# Patient Record
Sex: Female | Born: 1995 | Race: Black or African American | Hispanic: No | Marital: Single | Smoking: Former smoker
Health system: Southern US, Community
[De-identification: ages and names within clinical notes are randomized; demographics above are authoritative.]

## PROBLEM LIST (undated history)

## (undated) ENCOUNTER — Inpatient Hospital Stay (HOSPITAL_COMMUNITY): Payer: Self-pay

## (undated) DIAGNOSIS — B999 Unspecified infectious disease: Secondary | ICD-10-CM

## (undated) DIAGNOSIS — J45909 Unspecified asthma, uncomplicated: Secondary | ICD-10-CM

## (undated) HISTORY — PX: WISDOM TOOTH EXTRACTION: SHX21

---

## 2014-10-14 ENCOUNTER — Encounter (HOSPITAL_COMMUNITY): Payer: Self-pay | Admitting: *Deleted

## 2014-10-14 ENCOUNTER — Emergency Department (HOSPITAL_COMMUNITY)
Admission: EM | Admit: 2014-10-14 | Discharge: 2014-10-14 | Disposition: A | Discharge status: Home / Self Care | Payer: Medicaid Other | Attending: Emergency Medicine | Admitting: Emergency Medicine

## 2014-10-14 DIAGNOSIS — J45909 Unspecified asthma, uncomplicated: Secondary | ICD-10-CM | POA: Diagnosis present

## 2014-10-14 DIAGNOSIS — R062 Wheezing: Secondary | ICD-10-CM

## 2014-10-14 DIAGNOSIS — J4521 Mild intermittent asthma with (acute) exacerbation: Secondary | ICD-10-CM | POA: Insufficient documentation

## 2014-10-14 DIAGNOSIS — Z87891 Personal history of nicotine dependence: Secondary | ICD-10-CM | POA: Insufficient documentation

## 2014-10-14 DIAGNOSIS — J4520 Mild intermittent asthma, uncomplicated: Secondary | ICD-10-CM

## 2014-10-14 HISTORY — DX: Unspecified asthma, uncomplicated: J45.909

## 2014-10-14 MED ORDER — ALBUTEROL SULFATE (2.5 MG/3ML) 0.083% IN NEBU
5.0000 mg | INHALATION_SOLUTION | Freq: Once | RESPIRATORY_TRACT | Status: AC
  Administered 2014-10-14: 5 mg via RESPIRATORY_TRACT
  Filled 2014-10-14: qty 6

## 2014-10-14 MED ORDER — ALBUTEROL SULFATE HFA 108 (90 BASE) MCG/ACT IN AERS
1.0000 | INHALATION_SPRAY | RESPIRATORY_TRACT | Status: DC | PRN
  Administered 2014-10-14: 2 via RESPIRATORY_TRACT
  Filled 2014-10-14: qty 6.7

## 2014-10-14 MED ORDER — PREDNISONE 20 MG PO TABS
60.0000 mg | ORAL_TABLET | Freq: Once | ORAL | Status: AC
  Administered 2014-10-14: 60 mg via ORAL
  Filled 2014-10-14: qty 3

## 2014-10-14 MED ORDER — IPRATROPIUM BROMIDE 0.02 % IN SOLN
0.5000 mg | Freq: Once | RESPIRATORY_TRACT | Status: AC
  Administered 2014-10-14: 0.5 mg via RESPIRATORY_TRACT
  Filled 2014-10-14: qty 2.5

## 2014-10-14 NOTE — ED Provider Notes (Signed)
CSN: [id number]     Arrival date & time 10/14/14  1478 History   First MD Initiated Contact with Patient 10/14/14 [phone]     Chief Complaint  Patient presents with  . Asthma     (Consider location/radiation/quality/duration/timing/severity/associated sxs/prior Treatment) The history is provided by the patient and medical records.   This is a 19 year old female with past medical history significant for asthma, presenting to the ED for wheezing. Patient states her asthma has been acting up for the past 2 weeks. She states this is typical with change of season, especially spring due to all the pollen. She is not currently taking any over-the-counter allergy medications. She denies any cough, fever, sweats, or chills. Patient recently relocated to [facility] does not have a PCP or any asthma medications currently.  VSS.  No past medical history on file. No past surgical history on file. No family history on file. History  Substance Use Topics  . Smoking status: Not on file  . Smokeless tobacco: Not on file  . Alcohol Use: Not on file   OB History    No data available     Review of Systems  Respiratory: Positive for wheezing.   All other systems reviewed and are negative.     Allergies  Review of patient's allergies indicates not on file.  Home Medications   Prior to Admission medications   Not on File   BP 142/90 mmHg  Pulse 108  Temp(Src) 99.1 F (37.3 C) (Oral)  Resp 14  SpO2 100%   Physical Exam  Constitutional: She is oriented to person, place, and time. She appears well-developed and well-nourished. No distress.  HENT:  Head: Normocephalic and atraumatic.  Right Ear: Tympanic membrane and ear canal normal.  Left Ear: Tympanic membrane and ear canal normal.  Nose: Nose normal.  Mouth/Throat: Uvula is midline, oropharynx is clear and moist and mucous membranes are normal.  Eyes: Conjunctivae and EOM are normal. Pupils are equal, round, and reactive to light.   Neck: Normal range of motion. Neck supple.  Cardiovascular: Normal rate, regular rhythm and normal heart sounds.   Pulmonary/Chest: Effort normal. No respiratory distress. She has wheezes.  Increased work of breathing, audible wheezing noted from bedside; diffuse inspiratory and expiratory wheezes throughout, speaking in full sentences  Abdominal: Soft. Bowel sounds are normal.  Musculoskeletal: Normal range of motion.  Neurological: She is alert and oriented to person, place, and time.  Skin: Skin is warm and dry. She is not diaphoretic.  Psychiatric: She has a normal mood and affect.  Nursing note and vitals reviewed.   ED Course  Procedures (including critical care time) Labs Review Labs Reviewed - No data to display  Imaging Review No results found.   EKG Interpretation None      MDM   Final diagnoses:  Asthma, mild intermittent, uncomplicated  Wheezing   19 year old female with asthma exacerbation due to change in season. This is typical for her. She is new to [facility] and does not have a primary care physician or any of her usual asthma medications. On exam, she has slightly increased work of breathing and audible wheezing but continues speaking in full sentences and is in no acute distress. She was given nebulizer treatments and dose of prednisone with significant improvement of her symptoms. She states she is feeling better. Her vital signs remained stable on room air. Patient given rescue inhaler here in the ED and discharged home. She was encouraged to establish care with new  PCP, resource guide given for reference.  Discussed plan with patient, he/she acknowledged understanding and agreed with plan of care.  Return precautions given for new or worsening symptoms.  Garlon HatchetLisa M Cynthie Garmon, PA-C 10/14/14 1102  Eber HongBrian Miller, MD 10/14/14 [phone]

## 2014-10-14 NOTE — Discharge Instructions (Signed)
Use albuterol inhaler as needed. Establish care with new primary care physician in the area, resource guide attached to help with this. Return to the ED for any new or worsening symptoms.   Emergency Department Resource Guide 1) Find a Doctor and Pay Out of Pocket Although you won't have to find out who is covered by your insurance plan, it is a good idea to ask around and get recommendations. You will then need to call the office and see if the doctor you have chosen will accept you as a new patient and what types of options they offer for patients who are self-pay. Some doctors offer discounts or will set up payment plans for their patients who do not have insurance, but you will need to ask so you aren't surprised when you get to your appointment.  2) Contact Your Local Health Department Not all health departments have doctors that can see patients for sick visits, but many do, so it is worth a call to see if yours does. If you don't know where your local health department is, you can check in your phone book. The CDC also has a tool to help you locate your state's health department, and many state websites also have listings of all of their local health departments.  3) Find a Walk-in Clinic If your illness is not likely to be very severe or complicated, you may want to try a walk in clinic. These are popping up all over the country in pharmacies, drugstores, and shopping centers. They're usually staffed by nurse practitioners or physician assistants that have been trained to treat common illnesses and complaints. They're usually fairly quick and inexpensive. However, if you have serious medical issues or chronic medical problems, these are probably not your best option.  No Primary Care Doctor: - Call Health Connect at  [phone] - they can help you locate a primary care doctor that  accepts your insurance, provides certain services, etc. - Physician Referral Service- [phone]  Chronic  Pain Problems: Organization         Address  Phone   Notes  [facility]  [phone] Patients need to be referred by their primary care doctor.   Medication Assistance: Organization         Address  Phone   Notes  [facility] [address] [place] [address] [place], [place] [id number] [phone] --Must be a resident of [place] [facility] -- Must have NO insurance coverage whatsoever (no Medicaid/ Medicare, etc.) -- The pt. MUST have a primary care doctor that directs their care regularly and follows them in the community   MedAssist  [phone]   [employer]  [phone]    Agencies that provide inexpensive medical care: Organization         Address  Phone   Notes  [facility]  [phone]   [facility]    [phone]   [facility] [address] [place], [place] [address] [phone]   [facility] [address], [place] [phone]   [facility]    [phone]   [facility]    [phone]   [facility]  [address], [place] Phone:  [phone], Fax:  [phone] Hours of Operation:  9 am - 6 pm, M-F.  Also accepts Medicaid/Medicare and self-pay.  [facility]  [address], [place] Phone: [phone], Fax: [phone]. Hours of Operation:  8:30 am - 5:30 pm, M-F.  Also accepts Medicaid and self-pay.  °[facility] [place] [address], [place] Phone: [phone]   °[facility] [address], [place], [place] [phone], Ext. [phone] Mondays & Thursdays: 7-9 AM.  First 15 patients are seen on a first come, first serve basis. °  ° °Medicaid-accepting [place] Providers: ° °Organization         Address  Phone   Notes  °[facility] [address], [place] [phone] Also  accepts self-pay patients.  °[facility] [address], [place] ° [phone]   °[facility] [address], [place] [phone]   °[facility] [address] [place] [address], [place] [phone]   °Veita Bland [address], [place]  ° [phone] Only accepts [place] Access Medicaid patients after they have their name applied to their card.  ° °Self-Pay (no insurance) in [place]: ° °Organization         Address  Phone   Notes  °Sickle Cell Patients, [facility] [address], [place] [phone]   °[facility] [address], [place] [phone]   °[facility] [place] ° [address], [place] [phone]   °[facility]/Dr. Osei-Bonsu ° [address] [place] [address], [place] or [address], [place] [phone] Phone number for both [place] and [place] locations is the same.  °[facility] [address], [place] [phone]   °[facility] [address] [place] [address], [place] or [address] [phone] °([phone]   °[facility] [address], [place] [phone], phone; [phone], fax Sees patients 1st and 3rd Saturday of every month.  Must not qualify for public or private insurance (i.e. Medicaid, Medicare, [place] Health Choice, Veterans' Benefits) • Household income should be no more than 200% of the poverty level •The clinic cannot treat you if you are pregnant or think you are pregnant • Sexually transmitted diseases are not treated at the clinic.  ° ° °Dental Care: °Organization         Address  Phone  Notes  °[place] [facility] [address], [place] [phone] Accepts children up to age 21 who are enrolled in Medicaid or [place] Health Choice; pregnant  women with a Medicaid card; and children who have applied for Medicaid or [place] Health Choice, but were declined, whose parents can pay a reduced fee at time of service.  °[place] [facility] [place]  [address], [place] [phone] Accepts children up to age 21 who are enrolled in Medicaid or [place] Health Choice; pregnant women with a Medicaid card; and children who have applied for Medicaid or  Health Choice, but were declined, whose parents can pay a reduced fee at time of service.  °[facility] ° [address],  [phone] Patients are seen by appointment only. Walk-ins are not accepted. [facility] will see patients 18 years of age and older. °Monday - Tuesday (8am-5pm) °Most Wednesdays (8:30-5pm) °$30 per visit, cash only  °[facility]  [address], [facility] [phone] Patients are seen by appointment only. Walk-ins are not accepted. [place] will see patients 60 years of age and older. One Wednesday Evening (Monthly: Volunteer Based).  $30 per visit, cash only  [place]  [phone] for adults; Children under age 4, call Graduate Pediatric Dentistry at [phone]. Children aged 49-14, please call [phone] to request a pediatric application.  Dental services are provided in all areas of dental care including fillings, crowns and bridges, complete and partial dentures, implants, gum treatment, root canals, and extractions. Preventive care is also provided. Treatment is provided to both adults and children. Patients are selected via a lottery and there is often a waiting list.   [facility] [address] [place]  [phone] [email or web address]   [facility] [address] [place], [place] [phone], Ext. [phone] Second and Fourth Thursday of each month, opens at 6:30 AM; Clinic ends at 9 AM.  Patients are  seen on a first-come first-served basis, and a limited number are seen during each clinic.   [facility]  [address] Hillard Danker [place], [place] [phone]   Eligibility Requirements You must have lived in [place], [place], or [place] counties for at least the last three months.   You cannot be eligible for state or federal sponsored [employer], including [employer], [place], or [employer].   You generally cannot be eligible for healthcare insurance through your employer.    How to apply: Eligibility screenings are held every Tuesday and Wednesday afternoon from 1:00 pm until 4:00 pm. You do not need an appointment for the interview!  [facility] [address] [place], [place]   [place] Department  [place]  [phone]    Behavioral Health Resources in the Community: Intensive Outpatient Programs Organization         Address  Phone  Notes  [facility] [place]. [address], [place], [place] [phone]   [facility] [address], [place], [place]   ADS: [facility] [address] [address], [place], [place]   [place] [address],  [place], [place] or [phone]   Substance Abuse Resources Organization         Address  Phone  Notes  [facility] Drug Services  [phone]   [place]  [phone]   [facility] [place]   Chinita Pester  [phone]   Residential & Outpatient Substance Abuse Program  [phone]   Psychological Services Organization         Address  Phone  Notes  [facility] [place]  [place]  [phone]   [place] [address] [place] [phone] or [phone]    Mobile Crisis  Teams Organization         Address  Phone  Notes  [facility], Mobile Crisis Care Unit  [phone]   [facility]  [address] [place], [place]   [facility] [address] [place] [phone]    Self-Help/Support Groups Organization         Address  Phone             Notes  [facility]  [facility] [place] - variety of support groups  [place] Call for more information  Narcotics Anonymous (NA), [facility] [address], [employer] [place]  2 meetings at this location   Special educational needs teacher         Address  Phone  Notes  [facility] [place],    [place]  [phone]   [facility]  [address], [place] [address], [place], [place]   [place], [place] [phone] Admissions: 8am-3pm M-F  Incentives Substance [place] [address],    [place], [place] [phone]   [facility] [address] [place], [place], [place]   [facility] [address]  [place], [place]   [facility] - Intensive Outpatient [address], [place], [place]   [facility] [place]  [place], [place] [phone] or [phone]   [facility] ([facility]) [address] [place], [place] Accepts Medicaid  [facility] [place] [address],  [place] [phone] Substance Abuse/Addiction Treatment   [facility] Organization         Address  Phone  Notes  [facility]  [phone]   Domenic Schwab, PhD [address] Arlis Port[place], [place]   [phone] or [phone]   [place], [place] [phone]   [facility] [address], [place], [place] [phone]  Insurance/Medicaid/sponsorship through [facility] [address] [place] [address]                                    [place], [place] [phone] [place] [address], [place] [phone]    Dr. Adele Schilder  [phone]   [facility] Dept. 1) [address] [place] 2) [place] 3)  [address], [place] [phone]  [phone]   [place] [phone] or [phone] (After Hours)

## 2014-10-14 NOTE — ED Notes (Signed)
Pt states she has wheezing x 2 weeks.  States she just moved to this area and has no MD or asthma medication.

## 2014-10-14 NOTE — ED Provider Notes (Signed)
tp has hx of asthma - out of meds - recently moved here, increase wheeze and mild cough over last couple of days, on exam has mild expiratory wheezign, speaks in full sentences, no edeam, no distress,  Mdi, steroids, home.  Medical screening examination/treatment/procedure(s) were conducted as a shared visit with non-physician practitioner(s) and myself.  I personally evaluated the patient during the encounter.  Clinical Impression:   Final diagnoses:  Asthma, mild intermittent, uncomplicated  Wheezing         Eber HongBrian Tashiba Timoney, MD 10/14/14 1550

## 2014-10-25 ENCOUNTER — Encounter (HOSPITAL_COMMUNITY): Payer: Self-pay | Admitting: *Deleted

## 2014-10-25 ENCOUNTER — Emergency Department (HOSPITAL_COMMUNITY)
Admission: EM | Admit: 2014-10-25 | Discharge: 2014-10-25 | Disposition: A | Discharge status: Home / Self Care | Payer: Medicaid Other | Attending: Emergency Medicine | Admitting: Emergency Medicine

## 2014-10-25 DIAGNOSIS — J302 Other seasonal allergic rhinitis: Secondary | ICD-10-CM | POA: Diagnosis not present

## 2014-10-25 DIAGNOSIS — Z87891 Personal history of nicotine dependence: Secondary | ICD-10-CM | POA: Diagnosis not present

## 2014-10-25 DIAGNOSIS — R000 Tachycardia, unspecified: Secondary | ICD-10-CM | POA: Insufficient documentation

## 2014-10-25 DIAGNOSIS — J45909 Unspecified asthma, uncomplicated: Secondary | ICD-10-CM | POA: Diagnosis present

## 2014-10-25 DIAGNOSIS — R062 Wheezing: Secondary | ICD-10-CM

## 2014-10-25 DIAGNOSIS — J45901 Unspecified asthma with (acute) exacerbation: Secondary | ICD-10-CM | POA: Diagnosis not present

## 2014-10-25 MED ORDER — IPRATROPIUM-ALBUTEROL 0.5-2.5 (3) MG/3ML IN SOLN
3.0000 mL | RESPIRATORY_TRACT | Status: DC
  Administered 2014-10-25: 3 mL via RESPIRATORY_TRACT
  Filled 2014-10-25: qty 3

## 2014-10-25 MED ORDER — METHYLPREDNISOLONE SODIUM SUCC 125 MG IJ SOLR
125.0000 mg | Freq: Once | INTRAMUSCULAR | Status: AC
  Administered 2014-10-25: 125 mg via INTRAVENOUS
  Filled 2014-10-25: qty 2

## 2014-10-25 MED ORDER — ALBUTEROL SULFATE HFA 108 (90 BASE) MCG/ACT IN AERS
1.0000 | INHALATION_SPRAY | RESPIRATORY_TRACT | Status: DC
  Administered 2014-10-25: 2 via RESPIRATORY_TRACT
  Filled 2014-10-25: qty 6.7

## 2014-10-25 MED ORDER — PREDNISONE 20 MG PO TABS: ORAL_TABLET | ORAL | Status: DC

## 2014-10-25 NOTE — ED Notes (Signed)
Pt reports asthma has not improved since last ED visit on 10-14-14. Pt has been using rescue inhaler with any relief of asthma Sx's.

## 2014-10-25 NOTE — Discharge Instructions (Signed)
Metered Dose Inhaler (No Spacer Used) Inhaled medicines are the basis of treatment for asthma and other breathing problems. Inhaled medicine can only be effective if used properly. Good technique assures that the medicine reaches the lungs. Metered dose inhalers (MDIs) are used to deliver a variety of inhaled medicines. These include quick relief or rescue medicines (such as bronchodilators) and controller medicines (such as corticosteroids). The medicine is delivered by pushing down on a metal canister to release a set amount of spray. If you are using different kinds of inhalers, use your quick relief medicine to open the airways 10-15 minutes before using a steroid, if instructed to do so by your health care provider. If you are unsure which inhalers to use and the order of using them, ask your health care provider, nurse, or respiratory therapist. HOW TO USE THE INHALER 1. Remove the cap from the inhaler. 2. If you are using the inhaler for the first time, you will need to prime it. Shake the inhaler for 5 seconds and release four puffs into the air, away from your face. Ask your health care provider or pharmacist if you have questions about priming your inhaler. 3. Shake the inhaler for 5 seconds before each breath in (inhalation). 4. Position the inhaler so that the top of the canister faces up. 5. Put your index finger on the top of the medicine canister. Your thumb supports the bottom of the inhaler. 6. Open your mouth. 7. Either place the inhaler between your teeth and place your lips tightly around the mouthpiece, or hold the inhaler 1-2 inches away from your open mouth. If you are unsure of which technique to use, ask your health care provider. 8. Breathe out (exhale) normally and as completely as possible. 9. Press the canister down with the index finger to release the medicine. 10. At the same time as the canister is pressed, inhale deeply and slowly until your lungs are completely filled.  This should take 4-6 seconds. Keep your tongue down. 11. Hold the medicine in your lungs for 5-10 seconds (10 seconds is best). This helps the medicine get into the small airways of your lungs. 12. Breathe out slowly, through pursed lips. Whistling is an example of pursed lips. 13. Wait at least 1 minute between puffs. Continue with the above steps until you have taken the number of puffs your health care provider has ordered. Do not use the inhaler more than your health care provider directs you to. 14. Replace the cap on the inhaler. 15. Follow the directions from your health care provider or the inhaler insert for cleaning the inhaler. If you are using a steroid inhaler, after your last puff, rinse your mouth with water, gargle, and spit out the water. Do not swallow the water. AVOID:  Inhaling before or after starting the spray of medicine. It takes practice to coordinate your breathing with triggering the spray.  Inhaling through the nose (rather than the mouth) when triggering the spray. HOW TO DETERMINE IF YOUR INHALER IS FULL OR NEARLY EMPTY You cannot know when an inhaler is empty by shaking it. Some inhalers are now being made with dose counters. Ask your health care provider for a prescription that has a dose counter if you feel you need that extra help. If your inhaler does not have a counter, ask your health care provider to help you determine the date you need to refill your inhaler. Write the refill date on a calendar or your inhaler canister. Refill  your inhaler 7-10 days before it runs out. Be sure to keep an adequate supply of medicine. This includes making sure it has not expired, and making sure you have a spare inhaler. SEEK MEDICAL CARE IF:  Symptoms are only partially relieved with your inhaler.  You are having trouble using your inhaler.  You experience an increase in phlegm. SEEK IMMEDIATE MEDICAL CARE IF:  You feel little or no relief with your inhalers. You are still  wheezing and feeling shortness of breath, tightness in your chest, or both.  You have dizziness, headaches, or a fast heart rate.  You have chills, fever, or night sweats.  There is a noticeable increase in phlegm production, or there is blood in the phlegm. MAKE SURE YOU:  Understand these instructions.  Will watch your condition.  Will get help right away if you are not doing well or get worse. Document Released: 04/29/2007 Document Revised: 11/16/2013 Document Reviewed: 12/18/2012 [facility] Patient Information 2015 [employer] [place]. This information is not intended to replace advice given to you by your health care provider. Make sure you discuss any questions you have with your health care provider.  Please take your medications as prescribed. Please follow-up with your doctor or call health and wellness for further evaluation and management of your symptoms. Return to ED for new or worsening symptoms.

## 2014-10-25 NOTE — ED Provider Notes (Signed)
CSN: [id number]     Arrival date & time 10/25/14  1043 History  This chart was scribed for non-physician practitioner Joycie PeekBenjamin Rama Mcclintock, PA-C working with Raeford RazorStephen Kohut, MD by Littie Deedsichard Sun, ED Scribe. This patient was seen in room TR08C/TR08C and the patient's care was started at 12:00 PM.     Chief Complaint  Patient presents with  . Asthma   The history is provided by the patient. No language interpreter was used.   HPI Comments: Colleen Nichols is a 19 y.o. female with a hx of asthma who presents to the Emergency Department complaining of gradual onset worsening of her asthma that started over 3 weeks ago. She attributes this to seasonal allergies. Patient was seen for this in the ED on 10/14/14 and had been given an inhaler. She has had some wheezing, but she states the wheezing and her breathing have improved. States she felt warm this morning but that has since resolved.. Patient denies rhinorrhea, chest pain and leg swelling. She has recently relocated to the area and is still waiting to be established with a PCP. She tried to go to [facility] for asthma, but she was unable to be seen due to insurance reasons. No other aggravating modifying factors  Past Medical History  Diagnosis Date  . Asthma    History reviewed. No pertinent past surgical history. History reviewed. No pertinent family history. History  Substance Use Topics  . Smoking status: Former Games developermoker  . Smokeless tobacco: Not on file  . Alcohol Use: Yes   OB History    Gravida Para Term Preterm AB TAB SAB Ectopic Multiple Living   1              Review of Systems  HENT: Negative for rhinorrhea.   Respiratory: Positive for wheezing.   Cardiovascular: Negative for chest pain and leg swelling.  Allergic/Immunologic: Positive for environmental allergies.  All other systems reviewed and are negative.     Allergies  Review of patient's allergies indicates no known allergies.  Home Medications   Prior  to Admission medications   Medication Sig Start Date End Date Taking? Authorizing Provider  predniSONE (DELTASONE) 20 MG tablet 3 tabs po day one, then 2 tabs daily x 4 days 10/25/14   Joycie PeekBenjamin Jocelyn Lowery, PA-C   BP 122/76 mmHg  Pulse 102  Temp(Src) 98.4 F (36.9 C) (Oral)  Resp 18  Ht 5\' 5"  (1.651 m)  Wt 174 lb (78.926 kg)  BMI 28.96 kg/m2  SpO2 100%  Breastfeeding? No Physical Exam  Constitutional: She is oriented to person, place, and time. She appears well-developed and well-nourished. No distress.  HENT:  Head: Normocephalic and atraumatic.  Mouth/Throat: Oropharynx is clear and moist. No oropharyngeal exudate.  Eyes: Pupils are equal, round, and reactive to light.  Neck: Neck supple.  Cardiovascular: Normal heart sounds.  Tachycardia present.   Mildly tachycardic.  Pulmonary/Chest: Effort normal.  Musculoskeletal: She exhibits no edema.  Neurological: She is alert and oriented to person, place, and time. No cranial nerve deficit.  Skin: Skin is warm and dry. No rash noted.  Psychiatric: She has a normal mood and affect. Her behavior is normal.  Nursing note and vitals reviewed.   ED Course  Procedures  DIAGNOSTIC STUDIES: Oxygen Saturation is 98% on room air, normal by my interpretation.    COORDINATION OF CARE: 12:05 PM-Discussed treatment plan which includes inhaler and steroids with pt at bedside and pt agreed to plan.    Labs Review Labs  Reviewed - No data to display  Imaging Review No results found.   EKG Interpretation None     Meds given in ED:  Medications  ipratropium-albuterol (DUONEB) 0.5-2.5 (3) MG/3ML nebulizer solution 3 mL (3 mLs Nebulization Given 10/25/14 1122)  albuterol (PROVENTIL HFA;VENTOLIN HFA) 108 (90 BASE) MCG/ACT inhaler 1-2 puff (2 puffs Inhalation Given 10/25/14 1223)  methylPREDNISolone sodium succinate (SOLU-MEDROL) 125 mg/2 mL injection 125 mg (125 mg Intravenous Given 10/25/14 1223)    New Prescriptions   PREDNISONE (DELTASONE)  20 MG TABLET    3 tabs po day one, then 2 tabs daily x 4 days   Filed Vitals:   10/25/14 1101 10/25/14 1211  BP:  122/76  Pulse: 124 102  Temp: 98.5 F (36.9 C) 98.4 F (36.9 C)  TempSrc: Oral Oral  Resp: 20 18  Height:  (1.651 m)   Weight: 174 lb (78.926 kg)   SpO2: 98% 100%     MDM  Vitals stable -  -afebrile, mild tachycardia likely associated with albuterol. Pt resting comfortably in ED. DuoNeb received in ED resolved wheezing symptoms. PE--normal lung exam. Grossly benign physical exam.  DDX--patient here for wheezing associated with asthma. Has not been able to establish care with primary care provider yet. Given Solu-Medrol 125 mg in the ED, DC with albuterol inhaler and steroid taper. Instructions to follow-up with her doctor, called health and wellness in order to establish care. Low concern for PE, no evidence of other acute or emergent pathology at this time. I discussed all relevant lab findings and imaging results with pt and they verbalized understanding. Discussed f/u with PCP within 48 hrs and return precautions, pt very amenable to plan.  Final diagnoses:  Wheezing    I personally performed the services described in this documentation, which was scribed in my presence. The recorded information has been reviewed and is accurate.    Joycie Peek, PA-C 10/25/14 1256  Raeford Razor, MD 10/25/14 [phone]

## 2015-01-11 ENCOUNTER — Encounter (HOSPITAL_COMMUNITY): Payer: Self-pay | Admitting: *Deleted

## 2015-01-11 ENCOUNTER — Emergency Department (HOSPITAL_COMMUNITY)
Admission: EM | Admit: 2015-01-11 | Discharge: 2015-01-11 | Disposition: A | Discharge status: Home / Self Care | Payer: Medicaid Other | Attending: Emergency Medicine | Admitting: Emergency Medicine

## 2015-01-11 DIAGNOSIS — J45909 Unspecified asthma, uncomplicated: Secondary | ICD-10-CM | POA: Insufficient documentation

## 2015-01-11 DIAGNOSIS — Z87891 Personal history of nicotine dependence: Secondary | ICD-10-CM | POA: Insufficient documentation

## 2015-01-11 DIAGNOSIS — B9689 Other specified bacterial agents as the cause of diseases classified elsewhere: Secondary | ICD-10-CM

## 2015-01-11 DIAGNOSIS — N760 Acute vaginitis: Secondary | ICD-10-CM | POA: Insufficient documentation

## 2015-01-11 LAB — WET PREP, GENITAL
Trich, Wet Prep: NONE SEEN
Yeast Wet Prep HPF POC: NONE SEEN

## 2015-01-11 MED ORDER — ALBUTEROL SULFATE HFA 108 (90 BASE) MCG/ACT IN AERS
2.0000 | INHALATION_SPRAY | RESPIRATORY_TRACT | Status: DC
  Administered 2015-01-11: 2 via RESPIRATORY_TRACT
  Filled 2015-01-11: qty 6.7

## 2015-01-11 MED ORDER — METRONIDAZOLE 500 MG PO TABS: 500.0000 mg | ORAL_TABLET | Freq: Two times a day (BID) | ORAL | Status: DC

## 2015-01-11 NOTE — ED Notes (Addendum)
Pt complains of vaginal itchiness and an odor starting today. Pt states she believes she may have a yeast infection. Pt denies discharge

## 2015-01-11 NOTE — ED Provider Notes (Signed)
CSN: [id number]     Arrival date & time 01/11/15  1832 History   First MD Initiated Contact with Patient 01/11/15 2135     Chief Complaint  Patient presents with  . Vaginal Itching     (Consider location/radiation/quality/duration/timing/severity/associated sxs/prior Treatment) HPI Comments: H/o yeast infection and this is similar--no fever, flank or abd pain--notes clear vaginal d/c  Patient is a 19 y.o. female presenting with vaginal itching. The history is provided by the patient.  Vaginal Itching This is a new problem. The current episode started 2 days ago. The problem occurs constantly. The problem has not changed since onset.Nothing aggravates the symptoms. Nothing relieves the symptoms. She has tried nothing for the symptoms.    Past Medical History  Diagnosis Date  . Asthma    History reviewed. No pertinent past surgical history. No family history on file. History  Substance Use Topics  . Smoking status: Former Games developermoker  . Smokeless tobacco: Not on file  . Alcohol Use: Yes   OB History    Gravida Para Term Preterm AB TAB SAB Ectopic Multiple Living   1              Review of Systems  All other systems reviewed and are negative.     Allergies  Review of patient's allergies indicates no known allergies.  Home Medications   Prior to Admission medications   Medication Sig Start Date End Date Taking? Authorizing Provider  predniSONE (DELTASONE) 20 MG tablet 3 tabs po day one, then 2 tabs daily x 4 days 10/25/14   Joycie PeekBenjamin Cartner, PA-C   BP 133/85 mmHg  Pulse 91  Temp(Src) 98.8 F (37.1 C) (Oral)  Resp 18  SpO2 98% Physical Exam  Constitutional: She is oriented to person, place, and time. She appears well-developed and well-nourished.  Non-toxic appearance. No distress.  HENT:  Head: Normocephalic and atraumatic.  Eyes: Conjunctivae, EOM and lids are normal. Pupils are equal, round, and reactive to light.  Neck: Normal range of motion. Neck supple. No  tracheal deviation present. No thyroid mass present.  Cardiovascular: Normal rate, regular rhythm and normal heart sounds.  Exam reveals no gallop.   No murmur heard. Pulmonary/Chest: Effort normal and breath sounds normal. No stridor. No respiratory distress. She has no decreased breath sounds. She has no wheezes. She has no rhonchi. She has no rales.  Abdominal: Soft. Normal appearance and bowel sounds are normal. She exhibits no distension. There is no tenderness. There is no rebound and no CVA tenderness.  Musculoskeletal: Normal range of motion. She exhibits no edema or tenderness.  Neurological: She is alert and oriented to person, place, and time. She has normal strength. No cranial nerve deficit or sensory deficit. GCS eye subscore is 4. GCS verbal subscore is 5. GCS motor subscore is 6.  Skin: Skin is warm and dry. No abrasion and no rash noted.  Psychiatric: She has a normal mood and affect. Her speech is normal and behavior is normal.  Nursing note and vitals reviewed.   ED Course  Procedures (including critical care time) Labs Review Labs Reviewed  WET PREP, GENITAL  GC/CHLAMYDIA PROBE AMP ([place]) NOT AT [facility]    Imaging Review No results found.   EKG Interpretation None      MDM   Final diagnoses:  None    Patient to be treated for bacterial vaginosis    Lorre NickAnthony Jadelin Eng, MD 01/11/15 2234

## 2015-01-11 NOTE — Discharge Instructions (Signed)
Bacterial Vaginosis Bacterial vaginosis is a vaginal infection that occurs when the normal balance of bacteria in the vagina is disrupted. It results from an overgrowth of certain bacteria. This is the most common vaginal infection in women of childbearing age. Treatment is important to prevent complications, especially in pregnant women, as it can cause a premature delivery. CAUSES  Bacterial vaginosis is caused by an increase in harmful bacteria that are normally present in smaller amounts in the vagina. Several different kinds of bacteria can cause bacterial vaginosis. However, the reason that the condition develops is not fully understood. RISK FACTORS Certain activities or behaviors can put you at an increased risk of developing bacterial vaginosis, including:  Having a new sex partner or multiple sex partners.  Douching.  Using an intrauterine device (IUD) for contraception. Women do not get bacterial vaginosis from toilet seats, bedding, swimming pools, or contact with objects around them. SIGNS AND SYMPTOMS  Some women with bacterial vaginosis have no signs or symptoms. Common symptoms include:  Grey vaginal discharge.  A fishlike odor with discharge, especially after sexual intercourse.  Itching or burning of the vagina and vulva.  Burning or pain with urination. DIAGNOSIS  Your health care provider will take a medical history and examine the vagina for signs of bacterial vaginosis. A sample of vaginal fluid may be taken. Your health care provider will look at this sample under a microscope to check for bacteria and abnormal cells. A vaginal pH test may also be done.  TREATMENT  Bacterial vaginosis may be treated with antibiotic medicines. These may be given in the form of a pill or a vaginal cream. A second round of antibiotics may be prescribed if the condition comes back after treatment.  HOME CARE INSTRUCTIONS   Only take over-the-counter or prescription medicines as  directed by your health care provider.  If antibiotic medicine was prescribed, take it as directed. Make sure you finish it even if you start to feel better.  Do not have sex until treatment is completed.  Tell all sexual partners that you have a vaginal infection. They should see their health care provider and be treated if they have problems, such as a mild rash or itching.  Practice safe sex by using condoms and only having one sex partner. SEEK MEDICAL CARE IF:   Your symptoms are not improving after 3 days of treatment.  You have increased discharge or pain.  You have a fever. MAKE SURE YOU:   Understand these instructions.  Will watch your condition.  Will get help right away if you are not doing well or get worse. FOR MORE INFORMATION  Centers for Disease Control and Prevention, Division of STD Prevention: www.cdc.gov/std American Sexual Health Association (ASHA): www.ashastd.org  Document Released: 07/02/2005 Document Revised: 04/22/2013 Document Reviewed: 02/11/2013 ExitCare Patient Information 2015 [employer]. This information is not intended to replace advice given to you by your health care provider. Make sure you discuss any questions you have with your health care provider.  

## 2015-01-12 LAB — GC/CHLAMYDIA PROBE AMP (~~LOC~~) NOT AT ARMC
CHLAMYDIA, DNA PROBE: NEGATIVE
Neisseria Gonorrhea: NEGATIVE

## 2015-03-28 ENCOUNTER — Emergency Department (HOSPITAL_COMMUNITY)
Admission: EM | Admit: 2015-03-28 | Discharge: 2015-03-28 | Disposition: A | Discharge status: Home / Self Care | Payer: Medicaid Other | Attending: Emergency Medicine | Admitting: Emergency Medicine

## 2015-03-28 ENCOUNTER — Encounter (HOSPITAL_COMMUNITY): Payer: Self-pay | Admitting: Emergency Medicine

## 2015-03-28 DIAGNOSIS — Z3202 Encounter for pregnancy test, result negative: Secondary | ICD-10-CM | POA: Insufficient documentation

## 2015-03-28 DIAGNOSIS — N390 Urinary tract infection, site not specified: Secondary | ICD-10-CM | POA: Insufficient documentation

## 2015-03-28 DIAGNOSIS — Z79899 Other long term (current) drug therapy: Secondary | ICD-10-CM | POA: Insufficient documentation

## 2015-03-28 DIAGNOSIS — J45909 Unspecified asthma, uncomplicated: Secondary | ICD-10-CM | POA: Insufficient documentation

## 2015-03-28 LAB — COMPREHENSIVE METABOLIC PANEL
ALBUMIN: 5 g/dL (ref 3.5–5.0)
ALT: 24 U/L (ref 14–54)
ANION GAP: 9 (ref 5–15)
AST: 35 U/L (ref 15–41)
Alkaline Phosphatase: 73 U/L (ref 38–126)
BUN: 8 mg/dL (ref 6–20)
CHLORIDE: 104 mmol/L (ref 101–111)
CO2: 26 mmol/L (ref 22–32)
Calcium: 9.6 mg/dL (ref 8.9–10.3)
Creatinine, Ser: 0.85 mg/dL (ref 0.44–1.00)
GFR calc Af Amer: >60 mL/min (ref >60)
GFR calc non Af Amer: >60 mL/min (ref >60)
GLUCOSE: 78 mg/dL (ref 65–99)
POTASSIUM: 3.6 mmol/L (ref 3.5–5.1)
SODIUM: 139 mmol/L (ref 135–145)
Total Bilirubin: 1.7 mg/dL — ABNORMAL HIGH (ref 0.3–1.2)
Total Protein: 8.4 g/dL — ABNORMAL HIGH (ref 6.5–8.1)

## 2015-03-28 LAB — URINE MICROSCOPIC-ADD ON

## 2015-03-28 LAB — URINALYSIS, ROUTINE W REFLEX MICROSCOPIC
BILIRUBIN URINE: NEGATIVE
Glucose, UA: NEGATIVE mg/dL
Ketones, ur: 15 mg/dL — AB
Nitrite: POSITIVE — AB
PH: 5.5 (ref 5.0–8.0)
Protein, ur: 30 mg/dL — AB
SPECIFIC GRAVITY, URINE: 1.019 (ref 1.005–1.030)
Urobilinogen, UA: 1 mg/dL (ref 0.0–1.0)

## 2015-03-28 LAB — CBC
HEMATOCRIT: 40.7 % (ref 36.0–46.0)
Hemoglobin: 14.4 g/dL (ref 12.0–15.0)
MCH: 28.7 pg (ref 26.0–34.0)
MCHC: 35.4 g/dL (ref 30.0–36.0)
MCV: 81.2 fL (ref 78.0–100.0)
Platelets: 211 10*3/uL (ref 150–400)
RBC: 5.01 MIL/uL (ref 3.87–5.11)
RDW: 13.4 % (ref 11.5–15.5)
WBC: 9.9 10*3/uL (ref 4.0–10.5)

## 2015-03-28 LAB — WET PREP, GENITAL
CLUE CELLS WET PREP: NONE SEEN
Trich, Wet Prep: NONE SEEN
YEAST WET PREP: NONE SEEN

## 2015-03-28 LAB — LIPASE, BLOOD: LIPASE: 23 U/L (ref 22–51)

## 2015-03-28 LAB — HCG, QUANTITATIVE, PREGNANCY

## 2015-03-28 MED ORDER — CEPHALEXIN 500 MG PO CAPS: 500.0000 mg | ORAL_CAPSULE | Freq: Four times a day (QID) | ORAL | Status: DC

## 2015-03-28 MED ORDER — ALBUTEROL SULFATE HFA 108 (90 BASE) MCG/ACT IN AERS
2.0000 | INHALATION_SPRAY | Freq: Once | RESPIRATORY_TRACT | Status: AC
  Administered 2015-03-28: 2 via RESPIRATORY_TRACT
  Filled 2015-03-28: qty 6.7

## 2015-03-28 MED ORDER — CEPHALEXIN 250 MG PO CAPS: 250.0000 mg | ORAL_CAPSULE | Freq: Four times a day (QID) | ORAL | Status: DC

## 2015-03-28 NOTE — ED Notes (Signed)
Pt c/o mid medial abdominal pain onset today. Denies emesis or diarrhea. Pt also requests an inhaler for night-time wheezing with asthma. Denies SOB currently.

## 2015-03-28 NOTE — ED Notes (Signed)
Patient c/o lower abdominal pain x a few hours.  Pt states she had one episode of nausea today.  Pt denies vomiting and diarrhea.  Pt denies urinary symptoms and vaginal discharge.

## 2015-03-28 NOTE — ED Provider Notes (Signed)
CSN: [id number]     Arrival date & time 03/28/15  1632 History   First MD Initiated Contact with Patient 03/28/15 1924     Chief Complaint  Patient presents with  . Abdominal Pain     (Consider location/radiation/quality/duration/timing/severity/associated sxs/prior Treatment) HPI Colleen Nichols is a 19 y.o. female history of asthma, presents to emergency department complaining of abdominal pain. Patient states pain started several hours ago while she was at work. She reports pain is suprapubic, radiates to the back. Denies associated vaginal discharge or bleeding. She denies any urinary symptoms. She denies any nausea, vomiting, diarrhea. Pain is constant, feels crampy. She states she normally does not have menstrual periods because she is on Depakote. She denies being pregnant. She also requests albuterol inhaler, she states she usually wheezes at nighttime. Denies any complaints at this time. No treatment prior to coming in.    Past Medical History  Diagnosis Date  . Asthma    History reviewed. No pertinent past surgical history. History reviewed. No pertinent family history. Social History  Substance Use Topics  . Smoking status: Former Games developer  . Smokeless tobacco: None  . Alcohol Use: Yes   OB History    Gravida Para Term Preterm AB TAB SAB Ectopic Multiple Living   1              Review of Systems  Constitutional: Negative for fever and chills.  Respiratory: Negative for cough, chest tightness and shortness of breath.   Cardiovascular: Negative for chest pain, palpitations and leg swelling.  Gastrointestinal: Positive for abdominal pain. Negative for nausea, vomiting and diarrhea.  Genitourinary: Positive for pelvic pain. Negative for dysuria, urgency, hematuria, flank pain, vaginal bleeding, vaginal discharge and vaginal pain.  Musculoskeletal: Negative for myalgias, arthralgias, neck pain and neck stiffness.  Skin: Negative for rash.  Neurological: Negative for  dizziness, weakness and headaches.  All other systems reviewed and are negative.     Allergies  Review of patient's allergies indicates no known allergies.  Home Medications   Prior to Admission medications   Medication Sig Start Date End Date Taking? Authorizing Provider  PROAIR HFA 108 (90 BASE) MCG/ACT inhaler INHALE 1 TO 2 PFS PO Q 4 TO 6 H PRF COUGH OR WHEEZE 11/26/14  Yes Historical Provider, MD  metroNIDAZOLE (FLAGYL) 500 MG tablet Take 1 tablet (500 mg total) by mouth 2 (two) times daily. Patient not taking: Reported on 03/28/2015 01/11/15   Lorre Nick, MD   BP 132/82 mmHg  Pulse 92  Temp(Src) 98.3 F (36.8 C) (Oral)  Resp 18  SpO2 100% Physical Exam  Constitutional: She is oriented to person, place, and time. She appears well-developed and well-nourished. No distress.  HENT:  Head: Normocephalic.  Eyes: Conjunctivae are normal.  Neck: Neck supple.  Cardiovascular: Normal rate, regular rhythm and normal heart sounds.   Pulmonary/Chest: Effort normal and breath sounds normal. No respiratory distress. She has no wheezes. She has no rales.  Abdominal: Soft. Bowel sounds are normal. She exhibits no distension. There is tenderness. There is no rebound.  Suprapubic tenderness  Genitourinary:  Normal external genitalia. Normal vaginal canal. Small thin white discharge. Cervix is normal, closed. No CMT. No uterine or adnexal tenderness. No masses palpated.    Musculoskeletal: She exhibits no edema.  Neurological: She is alert and oriented to person, place, and time.  Skin: Skin is warm and dry.  Psychiatric: She has a normal mood and affect. Her behavior is normal.  Nursing note and vitals  reviewed.   ED Course  Procedures (including critical care time) Labs Review Labs Reviewed  COMPREHENSIVE METABOLIC PANEL - Abnormal; Notable for the following:    Total Protein 8.4 (*)    Total Bilirubin 1.7 (*)    All other components within normal limits  WET PREP, GENITAL   LIPASE, BLOOD  CBC  HCG, QUANTITATIVE, PREGNANCY  URINALYSIS, ROUTINE W REFLEX MICROSCOPIC (NOT AT [facility])  GC/CHLAMYDIA PROBE AMP ([place]) NOT AT [facility]    Imaging Review No results found. I have personally reviewed and evaluated these images and lab results as part of my medical decision-making.   EKG Interpretation None      MDM   Final diagnoses:  UTI (lower urinary tract infection)    patient suprapubic abdominal pain, no other complaints. No mcburneys point tenderness. Vital signs are normal. She is afebrile. Nontoxic appearing. Exam shows suprapubic tenderness. No adnexal tenderness. No concern for ovarian torsion or cyst.   UA showing infection. Pelvic exam unremarkable. Will treat with keflex. Pt also requesting inhaler for asthma, will provide. Follow up with pcp.   Filed Vitals:   03/28/15 1700 03/28/15 2131  BP: 132/82 122/76  Pulse: 92 111  Temp: 98.3 F (36.8 C)   TempSrc: Oral   Resp: 18 18  SpO2: 100% 100%       Jaynie Crumble, PA-C 03/29/15 0022  Lorre Nick, MD 03/29/15 2351

## 2015-03-28 NOTE — Discharge Instructions (Signed)
Take Keflex as prescribed until all gone for UTI. Drink plenty of fluids. Follow-up as needed..  Urinary Tract Infection Urinary tract infections (UTIs) can develop anywhere along your urinary tract. Your urinary tract is your body's drainage system for removing wastes and extra water. Your urinary tract includes two kidneys, two ureters, a bladder, and a urethra. Your kidneys are a pair of bean-shaped organs. Each kidney is about the size of your fist. They are located below your ribs, one on each side of your spine. CAUSES Infections are caused by microbes, which are microscopic organisms, including fungi, viruses, and bacteria. These organisms are so small that they can only be seen through a microscope. Bacteria are the microbes that most commonly cause UTIs. SYMPTOMS  Symptoms of UTIs may vary by age and gender of the patient and by the location of the infection. Symptoms in young women typically include a frequent and intense urge to urinate and a painful, burning feeling in the bladder or urethra during urination. Older women and men are more likely to be tired, shaky, and weak and have muscle aches and abdominal pain. A fever may mean the infection is in your kidneys. Other symptoms of a kidney infection include pain in your back or sides below the ribs, nausea, and vomiting. DIAGNOSIS To diagnose a UTI, your caregiver will ask you about your symptoms. Your caregiver also will ask to provide a urine sample. The urine sample will be tested for bacteria and white blood cells. White blood cells are made by your body to help fight infection. TREATMENT  Typically, UTIs can be treated with medication. Because most UTIs are caused by a bacterial infection, they usually can be treated with the use of antibiotics. The choice of antibiotic and length of treatment depend on your symptoms and the type of bacteria causing your infection. HOME CARE INSTRUCTIONS  If you were prescribed antibiotics, take them  exactly as your caregiver instructs you. Finish the medication even if you feel better after you have only taken some of the medication.  Drink enough water and fluids to keep your urine clear or pale yellow.  Avoid caffeine, tea, and carbonated beverages. They tend to irritate your bladder.  Empty your bladder often. Avoid holding urine for long periods of time.  Empty your bladder before and after sexual intercourse.  After a bowel movement, women should cleanse from front to back. Use each tissue only once. SEEK MEDICAL CARE IF:   You have back pain.  You develop a fever.  Your symptoms do not begin to resolve within 3 days. SEEK IMMEDIATE MEDICAL CARE IF:   You have severe back pain or lower abdominal pain.  You develop chills.  You have nausea or vomiting.  You have continued burning or discomfort with urination. MAKE SURE YOU:   Understand these instructions.  Will watch your condition.  Will get help right away if you are not doing well or get worse. Document Released: 04/11/2005 Document Revised: 01/01/2012 Document Reviewed: 08/10/2011 [facility] Patient Information 2015 [place], [place]. This information is not intended to replace advice given to you by your health care provider. Make sure you discuss any questions you have with your health care provider.

## 2015-03-28 NOTE — Progress Notes (Signed)
Patient listed as having Medicaid Family Planning.  EDCM spoke to patient at bedside. Patient confirms she does not have a pcp or insurance living in [place].  [facility] provided patient with contact information to [facility], informed patient of services there.  EDCM also provided patient with list of pcps who accept self pay patients, list of discount pharmacies and websites needymeds.org and GoodRX.com for medication assistance, phone number to inquire about the orange card, phone number to inquire about Mediciad, phone number to inquire about the Affordable Care Act, financial resources in the community such as local churches, salvation [employer], urban ministries, and dental assistance for uninsured patients. Patient reports her insurance is going to change through her job.  Patient thankful for resources.  No further EDCM needs at this time.

## 2015-03-29 LAB — GC/CHLAMYDIA PROBE AMP (~~LOC~~) NOT AT ARMC
Chlamydia: NEGATIVE
NEISSERIA GONORRHEA: NEGATIVE

## 2015-05-07 ENCOUNTER — Encounter (HOSPITAL_COMMUNITY): Payer: Self-pay | Admitting: Nurse Practitioner

## 2015-05-07 ENCOUNTER — Emergency Department (HOSPITAL_COMMUNITY)
Admission: EM | Admit: 2015-05-07 | Discharge: 2015-05-07 | Disposition: A | Discharge status: Home / Self Care | Payer: Medicaid Other | Attending: Emergency Medicine | Admitting: Emergency Medicine

## 2015-05-07 ENCOUNTER — Emergency Department (HOSPITAL_COMMUNITY): Payer: Medicaid Other

## 2015-05-07 DIAGNOSIS — J45909 Unspecified asthma, uncomplicated: Secondary | ICD-10-CM | POA: Diagnosis not present

## 2015-05-07 DIAGNOSIS — Z792 Long term (current) use of antibiotics: Secondary | ICD-10-CM | POA: Diagnosis not present

## 2015-05-07 DIAGNOSIS — Y9389 Activity, other specified: Secondary | ICD-10-CM | POA: Insufficient documentation

## 2015-05-07 DIAGNOSIS — S20212A Contusion of left front wall of thorax, initial encounter: Secondary | ICD-10-CM | POA: Insufficient documentation

## 2015-05-07 DIAGNOSIS — Z87891 Personal history of nicotine dependence: Secondary | ICD-10-CM | POA: Insufficient documentation

## 2015-05-07 DIAGNOSIS — S299XXA Unspecified injury of thorax, initial encounter: Secondary | ICD-10-CM | POA: Diagnosis present

## 2015-05-07 DIAGNOSIS — Y998 Other external cause status: Secondary | ICD-10-CM | POA: Diagnosis not present

## 2015-05-07 DIAGNOSIS — Y92410 Unspecified street and highway as the place of occurrence of the external cause: Secondary | ICD-10-CM | POA: Diagnosis not present

## 2015-05-07 MED ORDER — IBUPROFEN 800 MG PO TABS
800.0000 mg | ORAL_TABLET | Freq: Three times a day (TID) | ORAL | Status: DC
Start: 2015-05-07 — End: 2016-12-07

## 2015-05-07 MED ORDER — IBUPROFEN 800 MG PO TABS: 800.0000 mg | ORAL_TABLET | Freq: Three times a day (TID) | ORAL | Status: DC

## 2015-05-07 NOTE — Discharge Instructions (Signed)
Chest Contusion A chest contusion is a deep bruise on your chest area. Contusions are the result of an injury that caused bleeding under the skin. A chest contusion may involve bruising of the skin, muscles, or ribs. The contusion may turn blue, purple, or yellow. Minor injuries will give you a painless contusion, but more severe contusions may stay painful and swollen for a few weeks. CAUSES  A contusion is usually caused by a blow, trauma, or direct force to an area of the body. SYMPTOMS   Swelling and redness of the injured area.  Discoloration of the injured area.  Tenderness and soreness of the injured area.  Pain. DIAGNOSIS  The diagnosis can be made by taking a history and performing a physical exam. An X-ray, CT scan, or MRI may be needed to determine if there were any associated injuries, such as broken bones (fractures) or internal injuries. TREATMENT  Often, the best treatment for a chest contusion is resting, icing, and applying cold compresses to the injured area. Deep breathing exercises may be recommended to reduce the risk of pneumonia. Over-the-counter medicines may also be recommended for pain control. HOME CARE INSTRUCTIONS   Put ice on the injured area.  Put ice in a plastic bag.  Place a towel between your skin and the bag.  Leave the ice on for 15-20 minutes, 03-04 times a day.  Only take over-the-counter or prescription medicines as directed by your caregiver. Your caregiver may recommend avoiding anti-inflammatory medicines (aspirin, ibuprofen, and naproxen) for 48 hours because these medicines may increase bruising.  Rest the injured area.  Perform deep-breathing exercises as directed by your caregiver.  Stop smoking if you smoke.  Do not lift objects over 5 pounds (2.3 kg) for 3 days or longer if recommended by your caregiver. SEEK IMMEDIATE MEDICAL CARE IF:   You have increased bruising or swelling.  You have pain that is getting worse.  You have  difficulty breathing.  You have dizziness, weakness, or fainting.  You have blood in your urine or stool.  You cough up or vomit blood.  Your swelling or pain is not relieved with medicines. MAKE SURE YOU:   Understand these instructions.  Will watch your condition.  Will get help right away if you are not doing well or get worse.   This information is not intended to replace advice given to you by your health care provider. Make sure you discuss any questions you have with your health care provider.   Document Released: 03/27/2001 Document Revised: 03/26/2012 Document Reviewed: 12/24/2011 Elsevier Interactive Patient Education 2016 Elsevier Inc.  
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
## 2016-12-07 ENCOUNTER — Inpatient Hospital Stay (HOSPITAL_COMMUNITY)
Admission: AD | Admit: 2016-12-07 | Discharge: 2016-12-07 | Disposition: A | Discharge status: Home / Self Care | Payer: Medicaid Other | Source: Ambulatory Visit | Attending: Obstetrics & Gynecology | Admitting: Obstetrics & Gynecology

## 2016-12-07 ENCOUNTER — Inpatient Hospital Stay (HOSPITAL_BASED_OUTPATIENT_CLINIC_OR_DEPARTMENT_OTHER): Payer: Medicaid Other

## 2016-12-07 ENCOUNTER — Inpatient Hospital Stay (HOSPITAL_COMMUNITY): Payer: Medicaid Other

## 2016-12-07 ENCOUNTER — Encounter: Payer: Self-pay | Admitting: Student

## 2016-12-07 DIAGNOSIS — O99519 Diseases of the respiratory system complicating pregnancy, unspecified trimester: Secondary | ICD-10-CM

## 2016-12-07 DIAGNOSIS — J45909 Unspecified asthma, uncomplicated: Secondary | ICD-10-CM | POA: Diagnosis not present

## 2016-12-07 DIAGNOSIS — R109 Unspecified abdominal pain: Secondary | ICD-10-CM | POA: Diagnosis present

## 2016-12-07 DIAGNOSIS — O208 Other hemorrhage in early pregnancy: Secondary | ICD-10-CM | POA: Insufficient documentation

## 2016-12-07 DIAGNOSIS — O9989 Other specified diseases and conditions complicating pregnancy, childbirth and the puerperium: Secondary | ICD-10-CM | POA: Diagnosis not present

## 2016-12-07 DIAGNOSIS — Z3491 Encounter for supervision of normal pregnancy, unspecified, first trimester: Secondary | ICD-10-CM

## 2016-12-07 DIAGNOSIS — R0602 Shortness of breath: Secondary | ICD-10-CM | POA: Insufficient documentation

## 2016-12-07 DIAGNOSIS — R9431 Abnormal electrocardiogram [ECG] [EKG]: Secondary | ICD-10-CM | POA: Diagnosis not present

## 2016-12-07 DIAGNOSIS — O99512 Diseases of the respiratory system complicating pregnancy, second trimester: Secondary | ICD-10-CM | POA: Insufficient documentation

## 2016-12-07 DIAGNOSIS — R000 Tachycardia, unspecified: Secondary | ICD-10-CM | POA: Insufficient documentation

## 2016-12-07 DIAGNOSIS — O26891 Other specified pregnancy related conditions, first trimester: Secondary | ICD-10-CM | POA: Insufficient documentation

## 2016-12-07 DIAGNOSIS — O26892 Other specified pregnancy related conditions, second trimester: Secondary | ICD-10-CM | POA: Insufficient documentation

## 2016-12-07 DIAGNOSIS — R002 Palpitations: Secondary | ICD-10-CM

## 2016-12-07 DIAGNOSIS — Z3A01 Less than 8 weeks gestation of pregnancy: Secondary | ICD-10-CM | POA: Insufficient documentation

## 2016-12-07 DIAGNOSIS — O219 Vomiting of pregnancy, unspecified: Secondary | ICD-10-CM | POA: Diagnosis not present

## 2016-12-07 DIAGNOSIS — O3680X0 Pregnancy with inconclusive fetal viability, not applicable or unspecified: Secondary | ICD-10-CM

## 2016-12-07 DIAGNOSIS — Z87891 Personal history of nicotine dependence: Secondary | ICD-10-CM | POA: Diagnosis not present

## 2016-12-07 LAB — URINALYSIS, ROUTINE W REFLEX MICROSCOPIC
Bilirubin Urine: NEGATIVE
Glucose, UA: NEGATIVE mg/dL
Hgb urine dipstick: NEGATIVE
Ketones, ur: 5 mg/dL — AB
Leukocytes, UA: NEGATIVE
NITRITE: NEGATIVE
Protein, ur: NEGATIVE mg/dL
SPECIFIC GRAVITY, URINE: 1.02 (ref 1.005–1.030)
pH: 6 (ref 5.0–8.0)

## 2016-12-07 LAB — COMPREHENSIVE METABOLIC PANEL
ALK PHOS: 56 U/L (ref 38–126)
ALT: 14 U/L (ref 14–54)
ANION GAP: 6 (ref 5–15)
AST: 21 U/L (ref 15–41)
Albumin: 4.2 g/dL (ref 3.5–5.0)
BUN: 9 mg/dL (ref 6–20)
CALCIUM: 9.6 mg/dL (ref 8.9–10.3)
CO2: 25 mmol/L (ref 22–32)
Chloride: 103 mmol/L (ref 101–111)
Creatinine, Ser: 0.6 mg/dL (ref 0.44–1.00)
GFR calc non Af Amer: >60 mL/min (ref >60)
Glucose, Bld: 79 mg/dL (ref 65–99)
Potassium: 3.6 mmol/L (ref 3.5–5.1)
SODIUM: 134 mmol/L — AB (ref 135–145)
TOTAL PROTEIN: 6.9 g/dL (ref 6.5–8.1)
Total Bilirubin: 1.4 mg/dL — ABNORMAL HIGH (ref 0.3–1.2)

## 2016-12-07 LAB — ECHOCARDIOGRAM COMPLETE
HEIGHTINCHES: 65 in
Weight: 2140.8 oz

## 2016-12-07 LAB — CBC
HEMATOCRIT: 37.6 % (ref 36.0–46.0)
HEMOGLOBIN: 13.3 g/dL (ref 12.0–15.0)
MCH: 29.5 pg (ref 26.0–34.0)
MCHC: 35.4 g/dL (ref 30.0–36.0)
MCV: 83.4 fL (ref 78.0–100.0)
Platelets: 174 10*3/uL (ref 150–400)
RBC: 4.51 MIL/uL (ref 3.87–5.11)
RDW: 12.8 % (ref 11.5–15.5)
WBC: 4.6 10*3/uL (ref 4.0–10.5)

## 2016-12-07 LAB — POCT PREGNANCY, URINE: PREG TEST UR: POSITIVE — AB

## 2016-12-07 LAB — HCG, QUANTITATIVE, PREGNANCY: hCG, Beta Chain, Quant, S: 36514 m[IU]/mL — ABNORMAL HIGH (ref <5)

## 2016-12-07 LAB — WET PREP, GENITAL
SPERM: NONE SEEN
Trich, Wet Prep: NONE SEEN
Yeast Wet Prep HPF POC: NONE SEEN

## 2016-12-07 LAB — ABO/RH: ABO/RH(D): O POS

## 2016-12-07 MED ORDER — PROMETHAZINE HCL 25 MG PO TABS: 12.5000 mg | ORAL_TABLET | Freq: Four times a day (QID) | ORAL | 2 refills | Status: DC | PRN

## 2016-12-07 MED ORDER — ALBUTEROL SULFATE HFA 108 (90 BASE) MCG/ACT IN AERS: 2.0000 | INHALATION_SPRAY | Freq: Four times a day (QID) | RESPIRATORY_TRACT | 2 refills | Status: DC | PRN

## 2016-12-07 NOTE — MAU Note (Signed)
Pt states her last period was 10-30-2016.  She reports feeling short of breath for about the past 3 days. Her inhaler has been empty for several days. States her heart was racing this am. Also reports feeling very tired.  Pt denies bleeding, but reports some abdominal cramping today.

## 2016-12-07 NOTE — Progress Notes (Signed)
RN into room to discharge pt, pt not in room. Unable to find pt on unit to give discharge instructions. Pt did not receive AVS.

## 2016-12-07 NOTE — Discharge Instructions (Signed)
[facility]  ° ° °[facility]       Phone: [phone] ° °[facility] Phone: [phone] ° °[facility] [place]  Phone: [phone] ° °[facility] [place]  Phone: [phone] ° °[facility] [place]  Phone: [phone] ° °[facility]       Phone: [phone] ° °[facility]    Phone: [phone]  ° °[facility]    Phone: [phone] ° °[facility]    Phone: [phone] ° °[facility]    Phone: [phone] ° °[facility]    Phone: [phone] ° °[facility]       Phone: [phone]  ° °[facility]  Phone: [phone] ° °[facility]    Phone: [phone] ° °[facility]   Phone: [phone] ° °[facility]      Phone: [phone] ° °[facility]    Phone: [phone] ° °

## 2016-12-07 NOTE — Progress Notes (Signed)
  Echocardiogram 2D Echocardiogram has been performed.  Colleen Nichols, Colleen Nichols 12/07/2016, 4:36 PM
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
## 2016-12-08 LAB — HIV ANTIBODY (ROUTINE TESTING W REFLEX): HIV Screen 4th Generation wRfx: NONREACTIVE

## 2016-12-11 LAB — GC/CHLAMYDIA PROBE AMP (~~LOC~~) NOT AT ARMC
CHLAMYDIA, DNA PROBE: NEGATIVE
NEISSERIA GONORRHEA: NEGATIVE

## 2016-12-18 ENCOUNTER — Ambulatory Visit: Payer: Medicaid Other

## 2016-12-18 ENCOUNTER — Ambulatory Visit (HOSPITAL_COMMUNITY)
Admission: RE | Admit: 2016-12-18 | Discharge: 2016-12-18 | Disposition: A | Discharge status: Home / Self Care | Payer: Medicaid Other | Source: Ambulatory Visit | Attending: Advanced Practice Midwife | Admitting: Advanced Practice Midwife

## 2016-12-18 DIAGNOSIS — R109 Unspecified abdominal pain: Secondary | ICD-10-CM | POA: Diagnosis not present

## 2016-12-18 DIAGNOSIS — O3680X0 Pregnancy with inconclusive fetal viability, not applicable or unspecified: Secondary | ICD-10-CM | POA: Diagnosis not present

## 2016-12-18 DIAGNOSIS — Z3A01 Less than 8 weeks gestation of pregnancy: Secondary | ICD-10-CM | POA: Diagnosis not present

## 2016-12-18 DIAGNOSIS — Z712 Person consulting for explanation of examination or test findings: Secondary | ICD-10-CM

## 2016-12-18 DIAGNOSIS — O26891 Other specified pregnancy related conditions, first trimester: Secondary | ICD-10-CM | POA: Diagnosis not present

## 2016-12-18 NOTE — Progress Notes (Signed)
Patient presented to office today for a u/s results. Per U/S result patient has a viable pregnancy with expected due date of 08/13/2017. Patient was advised to start prenatal care and to continued taking prenatal vitamins.

## 2016-12-19 ENCOUNTER — Encounter: Payer: Medicaid Other | Admitting: Certified Nurse Midwife

## 2016-12-20 ENCOUNTER — Encounter (HOSPITAL_COMMUNITY): Payer: Self-pay | Admitting: *Deleted

## 2016-12-20 ENCOUNTER — Inpatient Hospital Stay (HOSPITAL_COMMUNITY)
Admission: AD | Admit: 2016-12-20 | Discharge: 2016-12-20 | Disposition: A | Discharge status: Home / Self Care | Payer: Medicaid Other | Source: Ambulatory Visit | Attending: Family Medicine | Admitting: Family Medicine

## 2016-12-20 DIAGNOSIS — O26891 Other specified pregnancy related conditions, first trimester: Secondary | ICD-10-CM | POA: Diagnosis not present

## 2016-12-20 DIAGNOSIS — O209 Hemorrhage in early pregnancy, unspecified: Secondary | ICD-10-CM | POA: Diagnosis not present

## 2016-12-20 DIAGNOSIS — O99511 Diseases of the respiratory system complicating pregnancy, first trimester: Secondary | ICD-10-CM | POA: Insufficient documentation

## 2016-12-20 DIAGNOSIS — J45909 Unspecified asthma, uncomplicated: Secondary | ICD-10-CM | POA: Insufficient documentation

## 2016-12-20 DIAGNOSIS — Z87891 Personal history of nicotine dependence: Secondary | ICD-10-CM | POA: Diagnosis not present

## 2016-12-20 DIAGNOSIS — Z79899 Other long term (current) drug therapy: Secondary | ICD-10-CM | POA: Diagnosis not present

## 2016-12-20 DIAGNOSIS — N898 Other specified noninflammatory disorders of vagina: Secondary | ICD-10-CM | POA: Diagnosis not present

## 2016-12-20 DIAGNOSIS — Z3A01 Less than 8 weeks gestation of pregnancy: Secondary | ICD-10-CM | POA: Diagnosis not present
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
## 2016-12-20 NOTE — MAU Note (Signed)
PT  SAYS SHE HAS VAG BLEEDING    STARTED ON 6-5     WHEN  SHE  WIPES - PINK .   NO PAD   ON IN TRIAGE .    FEELS  MILD  CRAMPS - 2. NO MEDS  FOR PAIN.   LAST SEX-   YESTERDAY.   HAD U/S ON 6-5  OUTPT.

## 2016-12-20 NOTE — MAU Note (Signed)
Urine sent to lab 

## 2016-12-20 NOTE — Discharge Instructions (Signed)
Vaginal Bleeding During Pregnancy, First Trimester °A small amount of bleeding (spotting) from the vagina is common in early pregnancy. Sometimes the bleeding is normal and is not a problem, and sometimes it is a sign of something serious. Be sure to tell your doctor about any bleeding from your vagina right away. °Follow these instructions at home: °· Watch your condition for any changes. °· Follow your doctor's instructions about how active you can be. °· If you are on bed rest: °? You may need to stay in bed and only get up to use the bathroom. °? You may be allowed to do some activities. °? If you need help, make plans for someone to help you. °· Write down: °? The number of pads you use each day. °? How often you change pads. °? How soaked (saturated) your pads are. °· Do not use tampons. °· Do not douche. °· Do not have sex or orgasms until your doctor says it is okay. °· If you pass any tissue from your vagina, save the tissue so you can show it to your doctor. °· Only take medicines as told by your doctor. °· Do not take aspirin because it can make you bleed. °· Keep all follow-up visits as told by your doctor. °Contact a doctor if: °· You bleed from your vagina. °· You have cramps. °· You have labor pains. °· You have a fever that does not go away after you take medicine. °Get help right away if: °· You have very bad cramps in your back or belly (abdomen). °· You pass large clots or tissue from your vagina. °· You bleed more. °· You feel light-headed or weak. °· You pass out (faint). °· You have chills. °· You are leaking fluid or have a gush of fluid from your vagina. °· You pass out while pooping (having a bowel movement). °This information is not intended to replace advice given to you by your health care provider. Make sure you discuss any questions you have with your health care provider. °Document Released: 11/16/2013 Document Revised: 12/08/2015 Document Reviewed: 03/09/2013 °Elsevier Interactive  Patient Education © 2018 [employer] ° °

## 2016-12-26 ENCOUNTER — Encounter: Payer: Medicaid Other | Admitting: Obstetrics

## 2017-01-10 ENCOUNTER — Ambulatory Visit (INDEPENDENT_AMBULATORY_CARE_PROVIDER_SITE_OTHER): Payer: Medicaid Other | Admitting: Certified Nurse Midwife

## 2017-01-10 ENCOUNTER — Encounter: Payer: Self-pay | Admitting: *Deleted

## 2017-01-10 ENCOUNTER — Encounter: Payer: Self-pay | Admitting: Certified Nurse Midwife

## 2017-01-10 ENCOUNTER — Other Ambulatory Visit (HOSPITAL_COMMUNITY)
Admission: RE | Admit: 2017-01-10 | Discharge: 2017-01-10 | Disposition: A | Discharge status: Home / Self Care | Payer: Medicaid Other | Source: Ambulatory Visit | Attending: Certified Nurse Midwife | Admitting: Certified Nurse Midwife

## 2017-01-10 VITALS — BP 124/75 | HR 98 | Wt 136.0 lb

## 2017-01-10 DIAGNOSIS — Z3481 Encounter for supervision of other normal pregnancy, first trimester: Secondary | ICD-10-CM

## 2017-01-10 DIAGNOSIS — Z3491 Encounter for supervision of normal pregnancy, unspecified, first trimester: Secondary | ICD-10-CM | POA: Diagnosis not present

## 2017-01-10 DIAGNOSIS — Z3490 Encounter for supervision of normal pregnancy, unspecified, unspecified trimester: Secondary | ICD-10-CM | POA: Diagnosis present

## 2017-01-10 MED ORDER — PRENATE PIXIE 10-0.6-0.4-200 MG PO CAPS: 1.0000 | ORAL_CAPSULE | Freq: Every day | ORAL | 12 refills | Status: DC

## 2017-01-10 NOTE — Progress Notes (Signed)
Subjective:    Colleen Nichols is being seen today for her first obstetrical visit.  This is a planned pregnancy. She is at [redacted]w[redacted]d gestation. Her obstetrical history is significant for Asthma. Relationship with FOB: spouse, living together. Patient does intend to breast feed. Pregnancy history fully reviewed.  The information documented in the HPI was reviewed and verified.  Menstrual History: OB History    Gravida Para Term Preterm AB Living   2 1 1     1    SAB TAB Ectopic Multiple Live Births           1       Patient's last menstrual period was 10/30/2016 (exact date).    Past Medical History:  Diagnosis Date  . Asthma     Past Surgical History:  Procedure Laterality Date  . WISDOM TOOTH EXTRACTION       (Not in a hospital admission) No Known Allergies  Social History  Substance Use Topics  . Smoking status: Former Games developer  . Smokeless tobacco: Never Used  . Alcohol use Yes     Comment: occasional    Family History  Problem Relation Age of Onset  . Diabetes Maternal Grandmother   . Hypertension Maternal Grandmother   . Hypertension Paternal Grandmother      Review of Systems Constitutional: negative for weight loss Gastrointestinal: negative for vomiting Genitourinary:negative for genital lesions and vaginal discharge and dysuria Musculoskeletal:negative for back pain Behavioral/Psych: negative for abusive relationship, depression, illegal drug usage and tobacco use    Objective:    BP 124/75   Pulse 98   Wt 136 lb (61.7 kg)   LMP 10/30/2016 (Exact Date)   BMI 22.63 kg/m  General Appearance:    Alert, cooperative, no distress, appears stated age  Head:    Normocephalic, without obvious abnormality, atraumatic  Eyes:    PERRL, conjunctiva/corneas clear, EOM's intact, fundi    benign, both eyes  Ears:    Normal TM's and external ear canals, both ears  Nose:   Nares normal, septum midline, mucosa normal, no drainage    or sinus tenderness  Throat:    Lips, mucosa, and tongue normal; teeth and gums normal  Neck:   Supple, symmetrical, trachea midline, no adenopathy;    thyroid:  no enlargement/tenderness/nodules; no carotid   bruit or JVD  Back:     Symmetric, no curvature, ROM normal, no CVA tenderness  Lungs:     Clear to auscultation bilaterally, respirations unlabored  Chest Wall:    No tenderness or deformity   Heart:    Regular rate and rhythm, S1 and S2 normal, no murmur, rub   or gallop  Breast Exam:    No tenderness, masses, or nipple abnormality  Abdomen:     Soft, non-tender, bowel sounds active all four quadrants,    no masses, no organomegaly  Genitalia:    Normal female without lesion, discharge or tenderness  Extremities:   Extremities normal, atraumatic, no cyanosis or edema  Pulses:   2+ and symmetric all extremities  Skin:   Skin color, texture, turgor normal, no rashes or lesions  Lymph nodes:   Cervical, supraclavicular, and axillary nodes normal  Neurologic:   CNII-XII intact, normal strength, sensation and reflexes    throughout          Cervix: long, thick, closed and posterior.  FHR: 170's by doppler.  Size c/w dates.    Lab Review Urine pregnancy test Labs reviewed yes Radiologic studies reviewed yes Assessment:  Pregnancy at 3520w5d weeks    Plan:      Prenatal vitamins.  Counseling provided regarding continued use of seat belts, cessation of alcohol consumption, smoking or use of illicit drugs; infection precautions i.e., influenza/TDAP immunizations, toxoplasmosis,CMV, parvovirus, listeria and varicella; workplace safety, exercise during pregnancy; routine dental care, safe medications, sexual activity, hot tubs, saunas, pools, travel, caffeine use, fish and methlymercury, potential toxins, hair treatments, varicose veins Weight gain recommendations per IOM guidelines reviewed: underweight/BMI< 18.5--> gain 28 - 40 lbs; normal weight/BMI 18.5 - 24.9--> gain 25 - 35 lbs; overweight/BMI 25 - 29.9--> gain  15 - 25 lbs; obese/BMI >30->gain  11 - 20 lbs Problem list reviewed and updated. FIRST/CF mutation testing/NIPT/QUAD SCREEN/fragile X/Ashkenazi Jewish population testing/Spinal muscular atrophy discussed: requested. Role of ultrasound in pregnancy discussed; fetal survey: requested. Amniocentesis discussed: not indicated.  Meds ordered this encounter  Medications  . Prenat-FeAsp-Meth-FA-DHA w/o A (PRENATE PIXIE) 10-0.6-0.4-200 MG CAPS    Sig: Take 1 tablet by mouth daily.    Dispense:  30 capsule    Refill:  12    Please process coupon: Rx BIN: [id number], RxPCN: [id number], RxGRP: [id number]: [id number], Rx: [id number]: [id number]  SUF: 01   Orders Placed This Encounter  Procedures  . Culture, OB Urine  . Hemoglobinopathy evaluation  . Obstetric Panel, Including HIV  . Varicella zoster antibody, IgG  . VITAMIN D 25 Hydroxy (Vit-D Deficiency, Fractures)  . Hemoglobin A1c  . Cystic Fibrosis Mutation 97    Follow up in 4 weeks. 50% of 30 min visit spent on counseling and coordination of care.

## 2017-01-11 LAB — CERVICOVAGINAL ANCILLARY ONLY
Bacterial vaginitis: POSITIVE — AB
CHLAMYDIA, DNA PROBE: NEGATIVE
Candida vaginitis: NEGATIVE
NEISSERIA GONORRHEA: NEGATIVE
TRICH (WINDOWPATH): NEGATIVE

## 2017-01-15 ENCOUNTER — Other Ambulatory Visit: Payer: Self-pay | Admitting: Certified Nurse Midwife

## 2017-01-15 DIAGNOSIS — D573 Sickle-cell trait: Secondary | ICD-10-CM

## 2017-01-15 DIAGNOSIS — B9689 Other specified bacterial agents as the cause of diseases classified elsewhere: Secondary | ICD-10-CM

## 2017-01-15 DIAGNOSIS — B951 Streptococcus, group B, as the cause of diseases classified elsewhere: Secondary | ICD-10-CM | POA: Insufficient documentation

## 2017-01-15 DIAGNOSIS — N760 Acute vaginitis: Principal | ICD-10-CM

## 2017-01-15 DIAGNOSIS — R7989 Other specified abnormal findings of blood chemistry: Secondary | ICD-10-CM | POA: Insufficient documentation

## 2017-01-15 LAB — VARICELLA ZOSTER ANTIBODY, IGG: Varicella zoster IgG: 2038 index (ref >165)

## 2017-01-15 LAB — URINE CULTURE, OB REFLEX

## 2017-01-15 LAB — OBSTETRIC PANEL, INCLUDING HIV
ANTIBODY SCREEN: NEGATIVE
BASOS ABS: 0 10*3/uL (ref 0.0–0.2)
BASOS: 0 %
EOS (ABSOLUTE): 0.1 10*3/uL (ref 0.0–0.4)
Eos: 2 %
HEMATOCRIT: 39 % (ref 34.0–46.6)
HIV SCREEN 4TH GENERATION: NONREACTIVE
Hemoglobin: 13.1 g/dL (ref 11.1–15.9)
Hepatitis B Surface Ag: NEGATIVE
Immature Grans (Abs): 0 10*3/uL (ref 0.0–0.1)
Immature Granulocytes: 0 %
LYMPHS ABS: 1.7 10*3/uL (ref 0.7–3.1)
Lymphs: 22 %
MCH: 29 pg (ref 26.6–33.0)
MCHC: 33.6 g/dL (ref 31.5–35.7)
MCV: 87 fL (ref 79–97)
MONOCYTES: 4 %
Monocytes Absolute: 0.3 10*3/uL (ref 0.1–0.9)
NEUTROS ABS: 5.6 10*3/uL (ref 1.4–7.0)
Neutrophils: 72 %
PLATELETS: 198 10*3/uL (ref 150–379)
RBC: 4.51 x10E6/uL (ref 3.77–5.28)
RDW: 13.7 % (ref 12.3–15.4)
RPR: NONREACTIVE
RUBELLA: 5.55 {index} (ref >0.99)
Rh Factor: POSITIVE
WBC: 7.8 10*3/uL (ref 3.4–10.8)

## 2017-01-15 LAB — HEMOGLOBINOPATHY EVALUATION
HEMOGLOBIN F QUANTITATION: 0.8 % (ref 0.0–2.0)
HGB C: 0 %
HGB S: 35.2 % — AB
HGB VARIANT: 0 %
Hemoglobin A2 Quantitation: 3.7 % — ABNORMAL HIGH (ref 1.8–3.2)
Hgb A: 60.3 % — ABNORMAL LOW (ref 96.4–98.8)

## 2017-01-15 LAB — HEMOGLOBIN A1C
Est. average glucose Bld gHb Est-mCnc: 94 mg/dL
Hgb A1c MFr Bld: 4.9 % (ref 4.8–5.6)

## 2017-01-15 LAB — CULTURE, OB URINE

## 2017-01-15 LAB — VITAMIN D 25 HYDROXY (VIT D DEFICIENCY, FRACTURES): VIT D 25 HYDROXY: 25.8 ng/mL — AB (ref 30.0–100.0)

## 2017-01-15 MED ORDER — METRONIDAZOLE 0.75 % VA GEL: 1.0000 | Freq: Two times a day (BID) | VAGINAL | 0 refills | Status: DC

## 2017-01-15 MED ORDER — NITROFURANTOIN MONOHYD MACRO 100 MG PO CAPS: 100.0000 mg | ORAL_CAPSULE | Freq: Two times a day (BID) | ORAL | 0 refills | Status: DC

## 2017-01-15 MED ORDER — VITAMIN D (ERGOCALCIFEROL) 1.25 MG (50000 UNIT) PO CAPS: 50000.0000 [IU] | ORAL_CAPSULE | ORAL | 2 refills | Status: DC

## 2017-01-17 LAB — CYTOLOGY - PAP: DIAGNOSIS: NEGATIVE

## 2017-01-18 LAB — CYSTIC FIBROSIS MUTATION 97: Interpretation: NOT DETECTED

## 2017-01-21 ENCOUNTER — Other Ambulatory Visit: Payer: Self-pay | Admitting: Certified Nurse Midwife

## 2017-01-21 DIAGNOSIS — Z3480 Encounter for supervision of other normal pregnancy, unspecified trimester: Secondary | ICD-10-CM

## 2017-02-07 ENCOUNTER — Ambulatory Visit (INDEPENDENT_AMBULATORY_CARE_PROVIDER_SITE_OTHER): Payer: Medicaid Other | Admitting: Certified Nurse Midwife

## 2017-02-07 VITALS — BP 114/74 | HR 94 | Wt 141.0 lb

## 2017-02-07 DIAGNOSIS — B951 Streptococcus, group B, as the cause of diseases classified elsewhere: Secondary | ICD-10-CM

## 2017-02-07 DIAGNOSIS — Z3482 Encounter for supervision of other normal pregnancy, second trimester: Secondary | ICD-10-CM

## 2017-02-07 DIAGNOSIS — Z3480 Encounter for supervision of other normal pregnancy, unspecified trimester: Secondary | ICD-10-CM

## 2017-02-07 DIAGNOSIS — R7989 Other specified abnormal findings of blood chemistry: Secondary | ICD-10-CM

## 2017-02-07 NOTE — Progress Notes (Signed)
   PRENATAL VISIT NOTE  Subjective:  Colleen Nichols is a 21 y.o. G2P1001 at 1535w1d being seen today for ongoing prenatal care.  She is currently monitored for the following issues for this low-risk pregnancy and has Encounter for supervision of normal pregnancy, unspecified, unspecified trimester; Sickle cell trait (HCC); Low vitamin D level; and Positive GBS test on her problem list.  Patient reports no complaints.  Contractions: Not present. Vag. Bleeding: None.   . Denies leaking of fluid.   The following portions of the patient's history were reviewed and updated as appropriate: allergies, current medications, past family history, past medical history, past social history, past surgical history and problem list. Problem list updated.  Objective:   Vitals:   02/07/17 1312  BP: 114/74  Pulse: 94  Weight: 141 lb (64 kg)    Fetal Status: Fetal Heart Rate (bpm): 155 Fundal Height: 17 cm       General:  Alert, oriented and cooperative. Patient is in no acute distress.  Skin: Skin is warm and dry. No rash noted.   Cardiovascular: Normal heart rate noted  Respiratory: Normal respiratory effort, no problems with respiration noted  Abdomen: Soft, gravid, appropriate for gestational age.  Pain/Pressure: Absent     Pelvic: Cervical exam deferred        Extremities: Normal range of motion.     Mental Status:  Normal mood and affect. Normal behavior. Normal judgment and thought content.   Assessment and Plan:  Pregnancy: G2P1001 at 4835w1d  1. Supervision of other normal pregnancy, antepartum     Doing well.  S>D.   Rudell Cobb- MaterniT21 PLUS Core+SCA - Culture, OB Urine - US MFM OB COMP + 14 WK; Future  2. Low vitamin D level     Taking vitamin D  3. Positive GBS test     PCN for labor/delivery  Preterm labor symptoms and general obstetric precautions including but not limited to vaginal bleeding, contractions, leaking of fluid and fetal movement were reviewed in detail with the  patient. Please refer to After Visit Summary for other counseling recommendations.  Return in about 4 weeks (around 03/07/2017) for ROB.   Roe Coombsachelle A Maysa Lynn, CNM

## 2017-02-09 LAB — URINE CULTURE, OB REFLEX

## 2017-02-09 LAB — CULTURE, OB URINE

## 2017-02-13 ENCOUNTER — Other Ambulatory Visit: Payer: Self-pay | Admitting: Certified Nurse Midwife

## 2017-02-13 DIAGNOSIS — Z3480 Encounter for supervision of other normal pregnancy, unspecified trimester: Secondary | ICD-10-CM

## 2017-02-13 LAB — MATERNIT21 PLUS CORE+SCA
CHROMOSOME 13: NEGATIVE
CHROMOSOME 21: NEGATIVE
Chromosome 18: NEGATIVE
Y Chromosome: DETECTED

## 2017-03-06 ENCOUNTER — Ambulatory Visit (HOSPITAL_COMMUNITY)
Admission: RE | Admit: 2017-03-06 | Discharge: 2017-03-06 | Disposition: A | Discharge status: Home / Self Care | Payer: Medicaid Other | Source: Ambulatory Visit | Attending: Certified Nurse Midwife | Admitting: Certified Nurse Midwife

## 2017-03-06 ENCOUNTER — Other Ambulatory Visit: Payer: Self-pay | Admitting: Certified Nurse Midwife

## 2017-03-06 DIAGNOSIS — D573 Sickle-cell trait: Secondary | ICD-10-CM | POA: Diagnosis not present

## 2017-03-06 DIAGNOSIS — Z3689 Encounter for other specified antenatal screening: Secondary | ICD-10-CM

## 2017-03-06 DIAGNOSIS — Z3480 Encounter for supervision of other normal pregnancy, unspecified trimester: Secondary | ICD-10-CM

## 2017-03-06 DIAGNOSIS — Z862 Personal history of diseases of the blood and blood-forming organs and certain disorders involving the immune mechanism: Secondary | ICD-10-CM | POA: Insufficient documentation

## 2017-03-06 DIAGNOSIS — Z3482 Encounter for supervision of other normal pregnancy, second trimester: Secondary | ICD-10-CM | POA: Insufficient documentation

## 2017-03-07 ENCOUNTER — Other Ambulatory Visit: Payer: Self-pay | Admitting: Certified Nurse Midwife

## 2017-03-07 ENCOUNTER — Ambulatory Visit (INDEPENDENT_AMBULATORY_CARE_PROVIDER_SITE_OTHER): Payer: Medicaid Other | Admitting: Obstetrics and Gynecology

## 2017-03-07 VITALS — BP 116/70 | HR 81 | Wt 150.6 lb

## 2017-03-07 DIAGNOSIS — Z3482 Encounter for supervision of other normal pregnancy, second trimester: Secondary | ICD-10-CM

## 2017-03-07 DIAGNOSIS — Z3480 Encounter for supervision of other normal pregnancy, unspecified trimester: Secondary | ICD-10-CM

## 2017-03-07 DIAGNOSIS — D573 Sickle-cell trait: Secondary | ICD-10-CM

## 2017-03-07 DIAGNOSIS — B951 Streptococcus, group B, as the cause of diseases classified elsewhere: Secondary | ICD-10-CM

## 2017-03-07 DIAGNOSIS — K30 Functional dyspepsia: Secondary | ICD-10-CM

## 2017-03-07 MED ORDER — RANITIDINE HCL 150 MG PO TABS: 150.0000 mg | ORAL_TABLET | Freq: Two times a day (BID) | ORAL | 2 refills | Status: DC

## 2017-03-07 NOTE — Progress Notes (Signed)
Subjective:  Colleen Nichols is a 21 y.o. G2P1001 at [redacted]w[redacted]d being seen today for ongoing prenatal care.  She is currently monitored for the following issues for this low-risk pregnancy and has Encounter for supervision of normal pregnancy, unspecified, unspecified trimester; Sickle cell trait (HCC); Low vitamin D level; Positive GBS test; and Indigestion on her problem list.  Patient reports heartburn.  Contractions: Not present. Vag. Bleeding: None.  Movement: Present. Denies leaking of fluid.   The following portions of the patient's history were reviewed and updated as appropriate: allergies, current medications, past family history, past medical history, past social history, past surgical history and problem list. Problem list updated.  Objective:   Vitals:   03/07/17 1110  BP: 116/70  Pulse: 81  Weight: 150 lb 9.6 oz (68.3 kg)    Fetal Status: Fetal Heart Rate (bpm): 154   Movement: Present     General:  Alert, oriented and cooperative. Patient is in no acute distress.  Skin: Skin is warm and dry. No rash noted.   Cardiovascular: Normal heart rate noted  Respiratory: Normal respiratory effort, no problems with respiration noted  Abdomen: Soft, gravid, appropriate for gestational age. Pain/Pressure: Absent     Pelvic:  Cervical exam deferred        Extremities: Normal range of motion.  Edema: None  Mental Status: Normal mood and affect. Normal behavior. Normal judgment and thought content.   Urinalysis:      Assessment and Plan:  Pregnancy: G2P1001 at [redacted]w[redacted]d  1. Supervision of other normal pregnancy, antepartum Stable - AFP TETRA Glucola next visit, 28 weeks d/t babyscripts  2. Positive GBS test Tx while in labor  3. Sickle cell trait (HCC) UC next visit  4. Indigestion  - ranitidine (ZANTAC) 150 MG tablet; Take 1 tablet (150 mg total) by mouth 2 (two) times daily.  Dispense: 60 tablet; Refill: 2  Preterm labor symptoms and general obstetric precautions including  but not limited to vaginal bleeding, contractions, leaking of fluid and fetal movement were reviewed in detail with the patient. Please refer to After Visit Summary for other counseling recommendations.  Return in about 8 weeks (around 05/02/2017).   Hermina Staggers, MD

## 2017-03-07 NOTE — Progress Notes (Signed)
Patient reports feeling fetal movement, denies pain. Pt is unsure if she started taking vitamin d because she said that she picked up something from the pharmacy but is unsure of what it was. Pt agreed to refill rx and take as prescribed.

## 2017-03-13 LAB — AFP TETRA
DIA Mom Value: 1.54
DIA Value (EIA): 275.18 pg/mL
DSR (By Age)    1 IN: 1108
DSR (Second Trimester) 1 IN: 516
GESTATIONAL AGE AFP: 19.1 wk
MSAFP Mom: 0.66
MSAFP: 35.9 ng/mL
MSHCG Mom: 1.52
MSHCG: 37873 m[IU]/mL
Maternal Age At EDD: 22.8 yr
OSB RISK: 10000
T18 (By Age): 1:4318 {titer}
Test Results:: NEGATIVE
UE3 MOM: 0.93
UE3 VALUE: 1.45 ng/mL
Weight: 150 [lb_av]

## 2017-03-14 ENCOUNTER — Other Ambulatory Visit: Payer: Self-pay | Admitting: Certified Nurse Midwife

## 2017-03-20 ENCOUNTER — Ambulatory Visit (HOSPITAL_COMMUNITY): Payer: No Typology Code available for payment source

## 2017-04-25 ENCOUNTER — Encounter: Payer: Self-pay | Admitting: Certified Nurse Midwife

## 2017-04-25 ENCOUNTER — Other Ambulatory Visit: Payer: Medicaid Other

## 2017-04-25 ENCOUNTER — Ambulatory Visit (INDEPENDENT_AMBULATORY_CARE_PROVIDER_SITE_OTHER): Payer: Medicaid Other | Admitting: Certified Nurse Midwife

## 2017-04-25 VITALS — BP 108/73 | HR 108 | Wt 168.0 lb

## 2017-04-25 DIAGNOSIS — B951 Streptococcus, group B, as the cause of diseases classified elsewhere: Secondary | ICD-10-CM

## 2017-04-25 DIAGNOSIS — R7989 Other specified abnormal findings of blood chemistry: Secondary | ICD-10-CM

## 2017-04-25 DIAGNOSIS — Z3482 Encounter for supervision of other normal pregnancy, second trimester: Secondary | ICD-10-CM

## 2017-04-25 DIAGNOSIS — Z3480 Encounter for supervision of other normal pregnancy, unspecified trimester: Secondary | ICD-10-CM

## 2017-04-25 NOTE — Progress Notes (Signed)
   PRENATAL VISIT NOTE  Subjective:  Colleen Nichols is a 21 y.o. G2P1001 at [redacted]w[redacted]d being seen today for ongoing prenatal care.  She is currently monitored for the following issues for this low-risk pregnancy and has Encounter for supervision of normal pregnancy, unspecified, unspecified trimester; Sickle cell trait (HCC); Low vitamin D level; Positive GBS test; and Indigestion on her problem list.  Patient reports no complaints.  Contractions: Not present. Vag. Bleeding: None.  Movement: Present. Denies leaking of fluid.   The following portions of the patient's history were reviewed and updated as appropriate: allergies, current medications, past family history, past medical history, past social history, past surgical history and problem list. Problem list updated.  Objective:   Vitals:   04/25/17 0946  BP: 108/73  Pulse: (!) 108  Weight: 168 lb (76.2 kg)    Fetal Status: Fetal Heart Rate (bpm): 153; doppler Fundal Height: 26 cm Movement: Present     General:  Alert, oriented and cooperative. Patient is in no acute distress.  Skin: Skin is warm and dry. No rash noted.   Cardiovascular: Normal heart rate noted  Respiratory: Normal respiratory effort, no problems with respiration noted  Abdomen: Soft, gravid, appropriate for gestational age.  Pain/Pressure: Absent     Pelvic: Cervical exam deferred        Extremities: Normal range of motion.  Edema: None  Mental Status:  Normal mood and affect. Normal behavior. Normal judgment and thought content.   Assessment and Plan:  Pregnancy: G2P1001 at [redacted]w[redacted]d  1. Supervision of other normal pregnancy, antepartum     States that she is doing well with baby scripts.   - Flu Vaccine QUAD 36+ mos IM (Fluarix, Quad PF) - Tdap vaccine greater than or equal to 7yo IM - Glucose Tolerance, 2 Hours w/1 Hour - HIV antibody - CBC - RPR  2. Low vitamin D level     Taking vitamin D weekly.   3. Positive GBS test     PCN for  labor/delivery  Preterm labor symptoms and general obstetric precautions including but not limited to vaginal bleeding, contractions, leaking of fluid and fetal movement were reviewed in detail with the patient. Please refer to After Visit Summary for other counseling recommendations.  Return in about 4 weeks (around 05/23/2017) for ROB, babyscripts.   Roe Coombs, CNM

## 2017-04-25 NOTE — Progress Notes (Signed)
Patient reports good fetal movement, denies pain. 

## 2017-04-26 LAB — CBC
Hematocrit: 33.9 % — ABNORMAL LOW (ref 34.0–46.6)
Hemoglobin: 11.1 g/dL (ref 11.1–15.9)
MCH: 29.1 pg (ref 26.6–33.0)
MCHC: 32.7 g/dL (ref 31.5–35.7)
MCV: 89 fL (ref 79–97)
PLATELETS: 169 10*3/uL (ref 150–379)
RBC: 3.82 x10E6/uL (ref 3.77–5.28)
RDW: 14.2 % (ref 12.3–15.4)
WBC: 7.1 10*3/uL (ref 3.4–10.8)

## 2017-04-26 LAB — GLUCOSE TOLERANCE, 2 HOURS W/ 1HR
GLUCOSE, 1 HOUR: 123 mg/dL (ref 65–179)
GLUCOSE, FASTING: 76 mg/dL (ref 65–91)
Glucose, 2 hour: 68 mg/dL (ref 65–152)

## 2017-04-26 LAB — RPR: RPR Ser Ql: NONREACTIVE

## 2017-04-26 LAB — HIV ANTIBODY (ROUTINE TESTING W REFLEX): HIV Screen 4th Generation wRfx: NONREACTIVE

## 2017-05-23 ENCOUNTER — Ambulatory Visit (INDEPENDENT_AMBULATORY_CARE_PROVIDER_SITE_OTHER): Payer: Medicaid Other | Admitting: Obstetrics and Gynecology

## 2017-05-23 ENCOUNTER — Encounter: Payer: Self-pay | Admitting: Obstetrics and Gynecology

## 2017-05-23 VITALS — BP 121/72 | HR 102 | Wt 177.0 lb

## 2017-05-23 DIAGNOSIS — Z3483 Encounter for supervision of other normal pregnancy, third trimester: Secondary | ICD-10-CM

## 2017-05-23 DIAGNOSIS — Z3480 Encounter for supervision of other normal pregnancy, unspecified trimester: Secondary | ICD-10-CM

## 2017-05-23 DIAGNOSIS — B951 Streptococcus, group B, as the cause of diseases classified elsewhere: Secondary | ICD-10-CM

## 2017-05-23 NOTE — Progress Notes (Signed)
   PRENATAL VISIT NOTE  Subjective:  Colleen Nichols is a 21 y.o. G2P1001 at 5295w1d being seen today for ongoing prenatal care.  She is currently monitored for the following issues for this low-risk pregnancy and has Encounter for supervision of normal pregnancy, unspecified, unspecified trimester; Sickle cell trait (HCC); Low vitamin D level; Positive GBS test; and Indigestion on their problem list.  Patient reports no complaints.  Contractions: Not present. Vag. Bleeding: None.  Movement: Present. Denies leaking of fluid.   The following portions of the patient's history were reviewed and updated as appropriate: allergies, current medications, past family history, past medical history, past social history, past surgical history and problem list. Problem list updated.  Objective:   Vitals:   05/23/17 1411  BP: 121/72  Pulse: (!) 102  Weight: 177 lb (80.3 kg)    Fetal Status: Fetal Heart Rate (bpm): 145(Simultaneous filing. User may not have seen previous data.) Fundal Height: 30 cm Movement: Present     General:  Alert, oriented and cooperative. Patient is in no acute distress.  Skin: Skin is warm and dry. No rash noted.   Cardiovascular: Normal heart rate noted  Respiratory: Normal respiratory effort, no problems with respiration noted  Abdomen: Soft, gravid, appropriate for gestational age.  Pain/Pressure: Absent     Pelvic: Cervical exam deferred        Extremities: Normal range of motion.  Edema: None  Mental Status:  Normal mood and affect. Normal behavior. Normal judgment and thought content.   Assessment and Plan:  Pregnancy: G2P1001 at 1095w1d  1. Supervision of other normal pregnancy, antepartum Patient is doing well without complaints Cultures Next visit Patient declined tdap  2. Positive GBS test Will offer prophylaxis in labor  Preterm labor symptoms and general obstetric precautions including but not limited to vaginal bleeding, contractions, leaking of fluid  and fetal movement were reviewed in detail with the patient. Please refer to After Visit Summary for other counseling recommendations.  Return in about 6 weeks (around 07/04/2017) for ROB.   Catalina AntiguaPeggy Jarelis Ehlert, MD

## 2017-05-23 NOTE — Progress Notes (Signed)
Patient reports good fetal movement, denies pain. 

## 2017-07-04 ENCOUNTER — Other Ambulatory Visit (HOSPITAL_COMMUNITY)
Admission: RE | Admit: 2017-07-04 | Discharge: 2017-07-04 | Disposition: A | Discharge status: Home / Self Care | Payer: Medicaid Other | Source: Ambulatory Visit | Attending: Obstetrics and Gynecology | Admitting: Obstetrics and Gynecology

## 2017-07-04 ENCOUNTER — Encounter: Payer: Self-pay | Admitting: Obstetrics and Gynecology

## 2017-07-04 ENCOUNTER — Ambulatory Visit (INDEPENDENT_AMBULATORY_CARE_PROVIDER_SITE_OTHER): Payer: Medicaid Other | Admitting: Obstetrics and Gynecology

## 2017-07-04 VITALS — BP 127/81 | HR 103 | Wt 186.0 lb

## 2017-07-04 DIAGNOSIS — Z3480 Encounter for supervision of other normal pregnancy, unspecified trimester: Secondary | ICD-10-CM

## 2017-07-04 DIAGNOSIS — O0930 Supervision of pregnancy with insufficient antenatal care, unspecified trimester: Secondary | ICD-10-CM | POA: Insufficient documentation

## 2017-07-04 DIAGNOSIS — O26849 Uterine size-date discrepancy, unspecified trimester: Secondary | ICD-10-CM

## 2017-07-04 DIAGNOSIS — B951 Streptococcus, group B, as the cause of diseases classified elsewhere: Secondary | ICD-10-CM

## 2017-07-04 NOTE — Addendum Note (Signed)
Addended by: Dalphine HandingGARDNER, Kaori Jumper L on: 07/04/2017 05:02 PM   Modules accepted: Orders

## 2017-07-04 NOTE — Patient Instructions (Signed)
Places to have your son circumcised:    [facility] [phone] $[phone] while you are in hospital  [facility] [phone] $[phone] by 4 wks  [facility] [phone] $[phone] by 2 wks  [facility] [phone] $[phone] by 7 days [facility] [phone] $[phone] by 4 wks  These prices sometimes change but are roughly what you can expect to pay. Please call and confirm pricing.   Circumcision is considered an elective/non-medically necessary procedure. There are many reasons parents decide to have their sons circumsized. During the first year of life circumcised males have a reduced risk of urinary tract infections but after this year the rates between circumcised males and uncircumcised males are the same.  It is safe to have your son circumcised outside of the hospital and the places above perform them regularly.   

## 2017-07-04 NOTE — Progress Notes (Signed)
   PRENATAL VISIT NOTE  Subjective:  Colleen Nichols is a 21 y.o. G2P1001 at 5884w1d being seen today for ongoing prenatal care.  She is currently monitored for the following issues for this low-risk pregnancy and has Encounter for supervision of normal pregnancy, unspecified, unspecified trimester; Sickle cell trait (HCC); Low vitamin D level; Positive GBS test; and Indigestion on their problem list.  Patient reports occasional contractions and pressure.  Contractions: Irregular. Vag. Bleeding: None.  Movement: Present. Denies leaking of fluid.   The following portions of the patient's history were reviewed and updated as appropriate: allergies, current medications, past family history, past medical history, past social history, past surgical history and problem list. Problem list updated.  Objective:   Vitals:   07/04/17 1403  BP: 127/81  Pulse: (!) 103  Weight: 186 lb (84.4 kg)    Fetal Status: Fetal Heart Rate (bpm): 145 Fundal Height: 37 cm Movement: Present     General:  Alert, oriented and cooperative. Patient is in no acute distress.  Skin: Skin is warm and dry. No rash noted.   Cardiovascular: Normal heart rate noted  Respiratory: Normal respiratory effort, no problems with respiration noted  Abdomen: Soft, gravid, appropriate for gestational age.  Pain/Pressure: Present     Pelvic: Cervical exam deferred        Extremities: Normal range of motion.  Edema: None  Mental Status:  Normal mood and affect. Normal behavior. Normal judgment and thought content.   Assessment and Plan:  Pregnancy: G2P1001 at 4784w1d  1. Supervision of other normal pregnancy, antepartum circ in hospital  2. Positive GBS test ppx in labor  4. Size of fetus inconsistent with dates, antepartum - US MFM OB FOLLOW UP; Future    Preterm labor symptoms and general obstetric precautions including but not limited to vaginal bleeding, contractions, leaking of fluid and fetal movement were reviewed in  detail with the patient. Please refer to After Visit Summary for other counseling recommendations.  Return in about 1 week (around 07/11/2017) for OB visit.   Conan BowensKelly M Davis, MD

## 2017-07-09 LAB — CERVICOVAGINAL ANCILLARY ONLY
BACTERIAL VAGINITIS: NEGATIVE
CHLAMYDIA, DNA PROBE: NEGATIVE
Candida vaginitis: NEGATIVE
NEISSERIA GONORRHEA: NEGATIVE
Trichomonas: NEGATIVE

## 2017-07-12 ENCOUNTER — Encounter: Payer: Self-pay | Admitting: Obstetrics

## 2017-07-12 ENCOUNTER — Ambulatory Visit (INDEPENDENT_AMBULATORY_CARE_PROVIDER_SITE_OTHER): Payer: Medicaid Other | Admitting: Obstetrics

## 2017-07-12 VITALS — BP 120/71 | HR 124 | Wt 187.2 lb

## 2017-07-12 DIAGNOSIS — O0930 Supervision of pregnancy with insufficient antenatal care, unspecified trimester: Secondary | ICD-10-CM

## 2017-07-12 DIAGNOSIS — B951 Streptococcus, group B, as the cause of diseases classified elsewhere: Secondary | ICD-10-CM

## 2017-07-12 DIAGNOSIS — D573 Sickle-cell trait: Secondary | ICD-10-CM

## 2017-07-12 DIAGNOSIS — O0933 Supervision of pregnancy with insufficient antenatal care, third trimester: Secondary | ICD-10-CM

## 2017-07-12 DIAGNOSIS — Z3483 Encounter for supervision of other normal pregnancy, third trimester: Secondary | ICD-10-CM

## 2017-07-12 DIAGNOSIS — Z3480 Encounter for supervision of other normal pregnancy, unspecified trimester: Secondary | ICD-10-CM

## 2017-07-12 NOTE — Progress Notes (Signed)
Subjective:  Colleen Nichols is a 21 y.o. G2P1001 at 7368w2d being seen today for ongoing prenatal care.  She is currently monitored for the following issues for this low-risk pregnancy and has Encounter for supervision of normal pregnancy, unspecified, unspecified trimester; Sickle cell trait (HCC); Low vitamin D level; Positive GBS test; and Indigestion on their problem list.  Patient reports no complaints.  Contractions: Not present. Vag. Bleeding: None.  Movement: Present. Denies leaking of fluid.   The following portions of the patient's history were reviewed and updated as appropriate: allergies, current medications, past family history, past medical history, past social history, past surgical history and problem list. Problem list updated.  Objective:   Vitals:   07/12/17 1317  BP: 120/71  Pulse: (!) 124  Weight: 187 lb 3.2 oz (84.9 kg)    Fetal Status: Fetal Heart Rate (bpm): 140   Movement: Present     General:  Alert, oriented and cooperative. Patient is in no acute distress.  Skin: Skin is warm and dry. No rash noted.   Cardiovascular: Normal heart rate noted  Respiratory: Normal respiratory effort, no problems with respiration noted  Abdomen: Soft, gravid, appropriate for gestational age. Pain/Pressure: Absent     Pelvic:  Cervical exam deferred        Extremities: Normal range of motion.  Edema: None  Mental Status: Normal mood and affect. Normal behavior. Normal judgment and thought content.   Urinalysis:      Assessment and Plan:  Pregnancy: G2P1001 at 968w2d  1. Supervision of other normal pregnancy, antepartum  2. Positive GBS test  3. Limited prenatal care, antepartum  4. Sickle cell trait (HCC)   Term labor symptoms and general obstetric precautions including but not limited to vaginal bleeding, contractions, leaking of fluid and fetal movement were reviewed in detail with the patient. Please refer to After Visit Summary for other counseling  recommendations.  Return in about 1 week (around 07/19/2017) for ROB.   Brock BadHarper, Charles A, MD

## 2017-07-12 NOTE — Progress Notes (Signed)
Patient reports good fetal movement, denies pain. 

## 2017-07-16 NOTE — L&D Delivery Note (Signed)
Delivery Note At  a viable female was delivered via  (Presentation:vertex ; LOA ).  APGAR:8 ,9 ; weight  .   Placenta status: spont, shultz.  Cord:3vc  with the following complications:none .  Cord pH: n/a  Anesthesia:  none Episiotomy:   Lacerations:  none Suture Repair: n/a Est. Blood Loss 200 (mL):    Mom to postpartum.  Baby to Couplet care / Skin to Skin.  Colleen Nichols 07/28/2017, 3:35 PM

## 2017-07-17 ENCOUNTER — Ambulatory Visit (HOSPITAL_COMMUNITY)
Admission: RE | Admit: 2017-07-17 | Discharge: 2017-07-17 | Disposition: A | Discharge status: Home / Self Care | Payer: Medicaid Other | Source: Ambulatory Visit | Attending: Obstetrics and Gynecology | Admitting: Obstetrics and Gynecology

## 2017-07-17 ENCOUNTER — Other Ambulatory Visit: Payer: Self-pay | Admitting: Obstetrics and Gynecology

## 2017-07-17 DIAGNOSIS — Z362 Encounter for other antenatal screening follow-up: Secondary | ICD-10-CM | POA: Diagnosis present

## 2017-07-17 DIAGNOSIS — Z3A38 38 weeks gestation of pregnancy: Secondary | ICD-10-CM | POA: Diagnosis not present

## 2017-07-17 DIAGNOSIS — Z3480 Encounter for supervision of other normal pregnancy, unspecified trimester: Secondary | ICD-10-CM

## 2017-07-17 DIAGNOSIS — O26843 Uterine size-date discrepancy, third trimester: Secondary | ICD-10-CM | POA: Diagnosis present

## 2017-07-17 DIAGNOSIS — Z862 Personal history of diseases of the blood and blood-forming organs and certain disorders involving the immune mechanism: Secondary | ICD-10-CM

## 2017-07-18 ENCOUNTER — Ambulatory Visit (INDEPENDENT_AMBULATORY_CARE_PROVIDER_SITE_OTHER): Payer: Medicaid Other | Admitting: Obstetrics and Gynecology

## 2017-07-18 ENCOUNTER — Encounter: Payer: Self-pay | Admitting: Obstetrics and Gynecology

## 2017-07-18 VITALS — BP 134/22 | HR 106 | Wt 189.6 lb

## 2017-07-18 DIAGNOSIS — B951 Streptococcus, group B, as the cause of diseases classified elsewhere: Secondary | ICD-10-CM

## 2017-07-18 DIAGNOSIS — Z3480 Encounter for supervision of other normal pregnancy, unspecified trimester: Secondary | ICD-10-CM

## 2017-07-18 NOTE — Progress Notes (Signed)
   PRENATAL VISIT NOTE  Subjective:  Colleen Nichols is a 22 y.o. G2P1001 at 2669w1d being seen today for ongoing prenatal care.  She is currently monitored for the following issues for this low-risk pregnancy and has Encounter for supervision of normal pregnancy, unspecified, unspecified trimester; Sickle cell trait (HCC); Low vitamin D level; Positive GBS test; and Indigestion on their problem list.  Patient reports irregular, stronger contractions.  Contractions: Irregular. Vag. Bleeding: None.  Movement: Present. Denies leaking of fluid.   The following portions of the patient's history were reviewed and updated as appropriate: allergies, current medications, past family history, past medical history, past social history, past surgical history and problem list. Problem list updated.  Objective:   Vitals:   07/18/17 1614  BP: (!) 134/22  Pulse: (!) 106  Weight: 189 lb 9.6 oz (86 kg)    Fetal Status: Fetal Heart Rate (bpm): 132   Movement: Present     General:  Alert, oriented and cooperative. Patient is in no acute distress.  Skin: Skin is warm and dry. No rash noted.   Cardiovascular: Normal heart rate noted  Respiratory: Normal respiratory effort, no problems with respiration noted  Abdomen: Soft, gravid, appropriate for gestational age.  Pain/Pressure: Present     Pelvic: Cervical exam deferred        Extremities: Normal range of motion.  Edema: None  Mental Status:  Normal mood and affect. Normal behavior. Normal judgment and thought content.   Assessment and Plan:  Pregnancy: G2P1001 at 2369w1d  1. Supervision of other normal pregnancy, antepartum  2. Positive GBS test ppx in labor  3. EFW > 4000 gms Last EFW 07/17/17 4085 gms  1st delivery 9'3" - briefly reviewed risks of shoulder dystocia with patient and indications for CS if EFW > 5 kg. She verbalizes understanding and affirms that she does not want CS if avoidable  Term labor symptoms and general obstetric  precautions including but not limited to vaginal bleeding, contractions, leaking of fluid and fetal movement were reviewed in detail with the patient. Please refer to After Visit Summary for other counseling recommendations.  Return in about 1 week (around 07/25/2017) for OB visit.   Conan BowensKelly M Davis, MD

## 2017-07-25 ENCOUNTER — Ambulatory Visit (INDEPENDENT_AMBULATORY_CARE_PROVIDER_SITE_OTHER): Payer: Medicaid Other | Admitting: Certified Nurse Midwife

## 2017-07-25 VITALS — BP 114/74 | HR 94 | Wt 190.2 lb

## 2017-07-25 DIAGNOSIS — R7989 Other specified abnormal findings of blood chemistry: Secondary | ICD-10-CM

## 2017-07-25 DIAGNOSIS — B951 Streptococcus, group B, as the cause of diseases classified elsewhere: Secondary | ICD-10-CM

## 2017-07-25 DIAGNOSIS — Z3480 Encounter for supervision of other normal pregnancy, unspecified trimester: Secondary | ICD-10-CM

## 2017-07-25 DIAGNOSIS — Z3483 Encounter for supervision of other normal pregnancy, third trimester: Secondary | ICD-10-CM

## 2017-07-26 ENCOUNTER — Inpatient Hospital Stay (HOSPITAL_COMMUNITY)
Admission: AD | Admit: 2017-07-26 | Discharge: 2017-07-26 | Disposition: A | Discharge status: Home / Self Care | Payer: Medicaid Other | Source: Ambulatory Visit | Attending: Obstetrics and Gynecology | Admitting: Obstetrics and Gynecology

## 2017-07-26 ENCOUNTER — Encounter (HOSPITAL_COMMUNITY): Payer: Self-pay

## 2017-07-26 DIAGNOSIS — Z3689 Encounter for other specified antenatal screening: Secondary | ICD-10-CM

## 2017-07-26 DIAGNOSIS — Z87891 Personal history of nicotine dependence: Secondary | ICD-10-CM | POA: Insufficient documentation

## 2017-07-26 DIAGNOSIS — N898 Other specified noninflammatory disorders of vagina: Secondary | ICD-10-CM | POA: Insufficient documentation

## 2017-07-26 DIAGNOSIS — O26893 Other specified pregnancy related conditions, third trimester: Secondary | ICD-10-CM | POA: Insufficient documentation

## 2017-07-26 DIAGNOSIS — Z3A39 39 weeks gestation of pregnancy: Secondary | ICD-10-CM | POA: Insufficient documentation

## 2017-07-26 NOTE — MAU Note (Signed)
Pt reports a lot of mucous that she states is "hanging out of my vagina"

## 2017-07-26 NOTE — Discharge Instructions (Signed)
Braxton Hicks Contractions °Contractions of the uterus can occur throughout pregnancy, but they are not always a sign that you are in labor. You may have practice contractions called Braxton Hicks contractions. These false labor contractions are sometimes confused with true labor. °What are Braxton Hicks contractions? °Braxton Hicks contractions are tightening movements that occur in the muscles of the uterus before labor. Unlike true labor contractions, these contractions do not result in opening (dilation) and thinning of the cervix. Toward the end of pregnancy (32-34 weeks), Braxton Hicks contractions can happen more often and may become stronger. These contractions are sometimes difficult to tell apart from true labor because they can be very uncomfortable. You should not feel embarrassed if you go to the hospital with false labor. °Sometimes, the only way to tell if you are in true labor is for your health care provider to look for changes in the cervix. The health care provider will do a physical exam and may monitor your contractions. If you are not in true labor, the exam should show that your cervix is not dilating and your water has not broken. °If there are other health problems associated with your pregnancy, it is completely safe for you to be sent home with false labor. You may continue to have Braxton Hicks contractions until you go into true labor. °How to tell the difference between true labor and false labor °True labor °· Contractions last 30-70 seconds. °· Contractions become very regular. °· Discomfort is usually felt in the top of the uterus, and it spreads to the lower abdomen and low back. °· Contractions do not go away with walking. °· Contractions usually become more intense and increase in frequency. °· The cervix dilates and gets thinner. °False labor °· Contractions are usually shorter and not as strong as true labor contractions. °· Contractions are usually irregular. °· Contractions  are often felt in the front of the lower abdomen and in the groin. °· Contractions may go away when you walk around or change positions while lying down. °· Contractions get weaker and are shorter-lasting as time goes on. °· The cervix usually does not dilate or become thin. °Follow these instructions at home: °· Take over-the-counter and prescription medicines only as told by your health care provider. °· Keep up with your usual exercises and follow other instructions from your health care provider. °· Eat and drink lightly if you think you are going into labor. °· If Braxton Hicks contractions are making you uncomfortable: °? Change your position from lying down or resting to walking, or change from walking to resting. °? Sit and rest in a tub of warm water. °? Drink enough fluid to keep your urine pale yellow. Dehydration may cause these contractions. °? Do slow and deep breathing several times an hour. °· Keep all follow-up prenatal visits as told by your health care provider. This is important. °Contact a health care provider if: °· You have a fever. °· You have continuous pain in your abdomen. °Get help right away if: °· Your contractions become stronger, more regular, and closer together. °· You have fluid leaking or gushing from your vagina. °· You pass blood-tinged mucus (bloody show). °· You have bleeding from your vagina. °· You have low back pain that you never had before. °· You feel your baby’s head pushing down and causing pelvic pressure. °· Your baby is not moving inside you as much as it used to. °Summary °· Contractions that occur before labor are called Braxton   Hicks contractions, false labor, or practice contractions. °· Braxton Hicks contractions are usually shorter, weaker, farther apart, and less regular than true labor contractions. True labor contractions usually become progressively stronger and regular and they become more frequent. °· Manage discomfort from Braxton Hicks contractions by  changing position, resting in a warm bath, drinking plenty of water, or practicing deep breathing. °This information is not intended to replace advice given to you by your health care provider. Make sure you discuss any questions you have with your health care provider. °Document Released: 11/15/2016 Document Revised: 11/15/2016 Document Reviewed: 11/15/2016 °Elsevier Interactive Patient Education © 2018 [employer] ° °

## 2017-07-26 NOTE — MAU Provider Note (Signed)
History     CSN: [id number]  Arrival date and time: 07/26/17 1414    Chief Complaint  Patient presents with  . Vaginal Discharge   G2P1001 @39 .2 wks here because mucous plug came out today. No VB, LOF, or ctx. Good FM. Pregnancy uncomplicated except GBS carrier.    OB History    Gravida Para Term Preterm AB Living   2 1 1     1    SAB TAB Ectopic Multiple Live Births           1      Past Medical History:  Diagnosis Date  . Asthma     Past Surgical History:  Procedure Laterality Date  . WISDOM TOOTH EXTRACTION      Family History  Problem Relation Age of Onset  . Diabetes Maternal Grandmother   . Hypertension Maternal Grandmother   . Hypertension Paternal Grandmother     Social History   Tobacco Use  . Smoking status: Former Games developermoker  . Smokeless tobacco: Never Used  Substance Use Topics  . Alcohol use: Yes    Comment: occasional  . Drug use: No    Allergies: No Known Allergies  Medications Prior to Admission  Medication Sig Dispense Refill Last Dose  . albuterol (PROVENTIL HFA;VENTOLIN HFA) 108 (90 Base) MCG/ACT inhaler Inhale 1-2 puffs into the lungs every 6 (six) hours as needed for wheezing or shortness of breath.   rescue  . Prenat-FeAsp-Meth-FA-DHA w/o A (PRENATE PIXIE) 10-0.6-0.4-200 MG CAPS Take 1 tablet by mouth daily. 30 capsule 12 Past Week at Unknown time  . Vitamin D, Ergocalciferol, (DRISDOL) 50000 units CAPS capsule Take 1 capsule (50,000 Units total) by mouth every 7 (seven) days. 30 capsule 2 2 weeks ago  . ranitidine (ZANTAC) 150 MG tablet Take 1 tablet (150 mg total) by mouth 2 (two) times daily. (Patient not taking: Reported on 07/26/2017) 60 tablet 2 Not Taking at Unknown time    Review of Systems  Gastrointestinal: Negative.   Genitourinary: Positive for vaginal discharge.   Physical Exam   Blood pressure 129/73, pulse 95, temperature 98.7 F (37.1 C), temperature source Oral, resp. rate 16, height 5\' 5"  (1.651 m), weight 86.6 kg  (191 lb), last menstrual period 10/30/2016, SpO2 100 %.  Physical Exam  Nursing note and vitals reviewed. Constitutional: She is oriented to person, place, and time. She appears well-developed and well-nourished. No distress.  HENT:  Head: Normocephalic and atraumatic.  Neck: Normal range of motion.  Respiratory: Effort normal. No respiratory distress.  GI: Soft. She exhibits no distension. There is no tenderness.  gravid  Genitourinary:  Genitourinary Comments: 1.5/thick per RN exam  Musculoskeletal: Normal range of motion.  Neurological: She is alert and oriented to person, place, and time.  Skin: Skin is warm and dry.  Psychiatric: She has a normal mood and affect.  EFM: 140 bpm, mod variability, + accels, no decels Toco: rare  No results found for this or any previous visit (from the past 24 hour(s)).  MAU Course  Procedures  MDM No evidence of labor. Discussed with pt mucous plug expulsion does not correlate with onset of labor. Stable for discharge home.  Assessment and Plan   1. [redacted] weeks gestation of pregnancy   2. NST (non-stress test) reactive   3. Vaginal discharge during pregnancy in third trimester    Discharge home Follow up in OB office as scheduled Labor precautions  Allergies as of 07/26/2017   No Known Allergies  Medication List    STOP taking these medications   ranitidine 150 MG tablet Commonly known as:  ZANTAC     TAKE these medications   albuterol 108 (90 Base) MCG/ACT inhaler Commonly known as:  PROVENTIL HFA;VENTOLIN HFA Inhale 1-2 puffs into the lungs every 6 (six) hours as needed for wheezing or shortness of breath.   PRENATE PIXIE 10-0.6-0.4-200 MG Caps Take 1 tablet by mouth daily.   Vitamin D (Ergocalciferol) 50000 units Caps capsule Commonly known as:  DRISDOL Take 1 capsule (50,000 Units total) by mouth every 7 (seven) days.      Donette Larry, CNM 07/26/2017, 3:07 PM

## 2017-07-28 ENCOUNTER — Other Ambulatory Visit: Payer: Self-pay

## 2017-07-28 ENCOUNTER — Encounter (HOSPITAL_COMMUNITY): Payer: Self-pay | Admitting: *Deleted

## 2017-07-28 ENCOUNTER — Inpatient Hospital Stay (HOSPITAL_COMMUNITY)
Admission: AD | Admit: 2017-07-28 | Discharge: 2017-07-29 | DRG: 807 | Disposition: A | Discharge status: Home / Self Care | Payer: Medicaid Other | Source: Ambulatory Visit | Attending: Obstetrics & Gynecology | Admitting: Obstetrics & Gynecology

## 2017-07-28 DIAGNOSIS — O9952 Diseases of the respiratory system complicating childbirth: Secondary | ICD-10-CM | POA: Diagnosis present

## 2017-07-28 DIAGNOSIS — D573 Sickle-cell trait: Secondary | ICD-10-CM | POA: Diagnosis present

## 2017-07-28 DIAGNOSIS — J45909 Unspecified asthma, uncomplicated: Secondary | ICD-10-CM | POA: Diagnosis present

## 2017-07-28 DIAGNOSIS — O9902 Anemia complicating childbirth: Secondary | ICD-10-CM | POA: Diagnosis present

## 2017-07-28 DIAGNOSIS — O479 False labor, unspecified: Secondary | ICD-10-CM

## 2017-07-28 DIAGNOSIS — Z87891 Personal history of nicotine dependence: Secondary | ICD-10-CM | POA: Diagnosis not present

## 2017-07-28 DIAGNOSIS — Z3483 Encounter for supervision of other normal pregnancy, third trimester: Secondary | ICD-10-CM | POA: Diagnosis present

## 2017-07-28 DIAGNOSIS — Z3A39 39 weeks gestation of pregnancy: Secondary | ICD-10-CM | POA: Diagnosis not present

## 2017-07-28 DIAGNOSIS — B951 Streptococcus, group B, as the cause of diseases classified elsewhere: Secondary | ICD-10-CM | POA: Diagnosis present

## 2017-07-28 DIAGNOSIS — O99824 Streptococcus B carrier state complicating childbirth: Secondary | ICD-10-CM | POA: Diagnosis present

## 2017-07-28 LAB — CBC
HCT: 38.7 % (ref 36.0–46.0)
Hemoglobin: 13 g/dL (ref 12.0–15.0)
MCH: 28.8 pg (ref 26.0–34.0)
MCHC: 33.6 g/dL (ref 30.0–36.0)
MCV: 85.6 fL (ref 78.0–100.0)
PLATELETS: 143 10*3/uL — AB (ref 150–400)
RBC: 4.52 MIL/uL (ref 3.87–5.11)
RDW: 14 % (ref 11.5–15.5)
WBC: 7.8 10*3/uL (ref 4.0–10.5)

## 2017-07-28 LAB — TYPE AND SCREEN
ABO/RH(D): O POS
Antibody Screen: NEGATIVE

## 2017-07-28 MED ORDER — ALBUTEROL SULFATE HFA 108 (90 BASE) MCG/ACT IN AERS
1.0000 | INHALATION_SPRAY | Freq: Four times a day (QID) | RESPIRATORY_TRACT | Status: DC | PRN
  Filled 2017-07-28: qty 6.7

## 2017-07-28 MED ORDER — PENICILLIN G POTASSIUM 5000000 UNITS IJ SOLR
5.0000 10*6.[IU] | Freq: Once | INTRAVENOUS | Status: AC
  Administered 2017-07-28: 5 10*6.[IU] via INTRAVENOUS
  Filled 2017-07-28: qty 5

## 2017-07-28 MED ORDER — FLEET ENEMA 7-19 GM/118ML RE ENEM: 1.0000 | ENEMA | RECTAL | Status: DC | PRN

## 2017-07-28 MED ORDER — ONDANSETRON HCL 4 MG PO TABS: 4.0000 mg | ORAL_TABLET | ORAL | Status: DC | PRN

## 2017-07-28 MED ORDER — SODIUM CHLORIDE 0.9% FLUSH: 3.0000 mL | INTRAVENOUS | Status: DC | PRN

## 2017-07-28 MED ORDER — ACETAMINOPHEN 325 MG PO TABS: 650.0000 mg | ORAL_TABLET | ORAL | Status: DC | PRN

## 2017-07-28 MED ORDER — COCONUT OIL OIL
1.0000 "application " | TOPICAL_OIL | Status: DC | PRN
  Administered 2017-07-28: 1 via TOPICAL
  Filled 2017-07-28: qty 120

## 2017-07-28 MED ORDER — ZOLPIDEM TARTRATE 5 MG PO TABS: 5.0000 mg | ORAL_TABLET | Freq: Every evening | ORAL | Status: DC | PRN

## 2017-07-28 MED ORDER — WITCH HAZEL-GLYCERIN EX PADS: 1.0000 "application " | MEDICATED_PAD | CUTANEOUS | Status: DC | PRN

## 2017-07-28 MED ORDER — IBUPROFEN 600 MG PO TABS
600.0000 mg | ORAL_TABLET | Freq: Four times a day (QID) | ORAL | Status: DC
  Administered 2017-07-28 – 2017-07-29 (×4): 600 mg via ORAL
  Filled 2017-07-28 (×3): qty 1

## 2017-07-28 MED ORDER — DIPHENHYDRAMINE HCL 25 MG PO CAPS: 25.0000 mg | ORAL_CAPSULE | Freq: Four times a day (QID) | ORAL | Status: DC | PRN

## 2017-07-28 MED ORDER — MORPHINE SULFATE (PF) 4 MG/ML IV SOLN: 2.0000 mg | Freq: Once | INTRAVENOUS | Status: DC

## 2017-07-28 MED ORDER — SENNOSIDES-DOCUSATE SODIUM 8.6-50 MG PO TABS
2.0000 | ORAL_TABLET | ORAL | Status: DC
  Administered 2017-07-28: 2 via ORAL
  Filled 2017-07-28 (×2): qty 2

## 2017-07-28 MED ORDER — ONDANSETRON HCL 4 MG/2ML IJ SOLN: 4.0000 mg | INTRAMUSCULAR | Status: DC | PRN

## 2017-07-28 MED ORDER — FENTANYL CITRATE (PF) 100 MCG/2ML IJ SOLN
100.0000 ug | INTRAMUSCULAR | Status: DC | PRN
  Administered 2017-07-28 (×4): 100 ug via INTRAVENOUS
  Filled 2017-07-28 (×5): qty 2

## 2017-07-28 MED ORDER — LACTATED RINGERS IV SOLN: INTRAVENOUS | Status: DC

## 2017-07-28 MED ORDER — OXYCODONE-ACETAMINOPHEN 5-325 MG PO TABS: 1.0000 | ORAL_TABLET | ORAL | Status: DC | PRN

## 2017-07-28 MED ORDER — PENICILLIN G POT IN DEXTROSE 60000 UNIT/ML IV SOLN
3.0000 10*6.[IU] | INTRAVENOUS | Status: DC
  Administered 2017-07-28: 3 10*6.[IU] via INTRAVENOUS
  Filled 2017-07-28 (×4): qty 50

## 2017-07-28 MED ORDER — TETANUS-DIPHTH-ACELL PERTUSSIS 5-2.5-18.5 LF-MCG/0.5 IM SUSP: 0.5000 mL | Freq: Once | INTRAMUSCULAR | Status: DC

## 2017-07-28 MED ORDER — SIMETHICONE 80 MG PO CHEW: 80.0000 mg | CHEWABLE_TABLET | ORAL | Status: DC | PRN

## 2017-07-28 MED ORDER — MEASLES, MUMPS & RUBELLA VAC ~~LOC~~ INJ
0.5000 mL | INJECTION | Freq: Once | SUBCUTANEOUS | Status: DC
  Filled 2017-07-28: qty 0.5

## 2017-07-28 MED ORDER — LACTATED RINGERS IV SOLN: 500.0000 mL | INTRAVENOUS | Status: DC | PRN

## 2017-07-28 MED ORDER — OXYCODONE-ACETAMINOPHEN 5-325 MG PO TABS: 2.0000 | ORAL_TABLET | ORAL | Status: DC | PRN

## 2017-07-28 MED ORDER — BENZOCAINE-MENTHOL 20-0.5 % EX AERO
1.0000 "application " | INHALATION_SPRAY | CUTANEOUS | Status: DC | PRN
  Administered 2017-07-28: 1 via TOPICAL
  Filled 2017-07-28: qty 56

## 2017-07-28 MED ORDER — PRENATAL MULTIVITAMIN CH
1.0000 | ORAL_TABLET | Freq: Every day | ORAL | Status: DC
  Administered 2017-07-29: 1 via ORAL

## 2017-07-28 MED ORDER — OXYTOCIN BOLUS FROM INFUSION
500.0000 mL | Freq: Once | INTRAVENOUS | Status: AC
  Administered 2017-07-28: 500 mL via INTRAVENOUS

## 2017-07-28 MED ORDER — ALBUTEROL SULFATE (2.5 MG/3ML) 0.083% IN NEBU: 2.5000 mg | INHALATION_SOLUTION | Freq: Four times a day (QID) | RESPIRATORY_TRACT | Status: DC | PRN

## 2017-07-28 MED ORDER — PROMETHAZINE HCL 25 MG PO TABS
25.0000 mg | ORAL_TABLET | Freq: Once | ORAL | Status: AC
  Administered 2017-07-28: 25 mg via ORAL
  Filled 2017-07-28: qty 1

## 2017-07-28 MED ORDER — ONDANSETRON HCL 4 MG/2ML IJ SOLN: 4.0000 mg | Freq: Four times a day (QID) | INTRAMUSCULAR | Status: DC | PRN

## 2017-07-28 MED ORDER — ACETAMINOPHEN 325 MG PO TABS
650.0000 mg | ORAL_TABLET | ORAL | Status: DC | PRN
  Administered 2017-07-29: 650 mg via ORAL
  Filled 2017-07-28: qty 2

## 2017-07-28 MED ORDER — LIDOCAINE HCL (PF) 1 % IJ SOLN
30.0000 mL | INTRAMUSCULAR | Status: DC | PRN
  Filled 2017-07-28: qty 30

## 2017-07-28 MED ORDER — DIBUCAINE 1 % RE OINT: 1.0000 "application " | TOPICAL_OINTMENT | RECTAL | Status: DC | PRN

## 2017-07-28 MED ORDER — SODIUM CHLORIDE 0.9% FLUSH: 3.0000 mL | Freq: Two times a day (BID) | INTRAVENOUS | Status: DC

## 2017-07-28 MED ORDER — MORPHINE SULFATE (PF) 2 MG/ML IJ SOLN: 2.0000 mg | Freq: Once | INTRAMUSCULAR | Status: DC

## 2017-07-28 MED ORDER — SOD CITRATE-CITRIC ACID 500-334 MG/5ML PO SOLN: 30.0000 mL | ORAL | Status: DC | PRN

## 2017-07-28 MED ORDER — SODIUM CHLORIDE 0.9 % IV SOLN: 250.0000 mL | INTRAVENOUS | Status: DC | PRN

## 2017-07-28 MED ORDER — OXYTOCIN 40 UNITS IN LACTATED RINGERS INFUSION - SIMPLE MED
2.5000 [IU]/h | INTRAVENOUS | Status: DC
  Filled 2017-07-28: qty 1000

## 2017-07-28 NOTE — MAU Note (Signed)
ctxs since 0500. Denies LOF or bleeding. 1cm last sve

## 2017-07-28 NOTE — H&P (Signed)
Colleen Nichols is a 22 y.o. female G2P1001 @ 39.4 wks presenting for SOL. contractions started early this morning. Denies vag bleeding or ROM. GBS pos OB History    Gravida Para Term Preterm AB Living   2 1 1     1    SAB TAB Ectopic Multiple Live Births           1     Past Medical History:  Diagnosis Date  . Asthma    Past Surgical History:  Procedure Laterality Date  . WISDOM TOOTH EXTRACTION     Family History: family history includes Diabetes in her maternal grandmother; Hypertension in her maternal grandmother and paternal grandmother. Social History:  reports that she has quit smoking. she has never used smokeless tobacco. She reports that she drinks alcohol. She reports that she does not use drugs.     Maternal Diabetes: No Genetic Screening: Normal Maternal Ultrasounds/Referrals: Normal Fetal Ultrasounds or other Referrals:  None Maternal Substance Abuse:  No Significant Maternal Medications:  None Significant Maternal Lab Results:  Lab values include: Group B Strep positive Other Comments:  None  Review of Systems  Constitutional: Negative.   HENT: Negative.   Eyes: Negative.   Respiratory: Negative.   Cardiovascular: Negative.   Gastrointestinal: Positive for abdominal pain.  Genitourinary: Negative.   Musculoskeletal: Negative.   Skin: Negative.   Neurological: Negative.   Endo/Heme/Allergies: Negative.   Psychiatric/Behavioral: Negative.    Maternal Medical History:  Reason for admission: Contractions.   Contractions: Onset was 6-12 hours ago.   Frequency: regular.   Perceived severity is moderate.    Fetal activity: Perceived fetal activity is normal.   Last perceived fetal movement was within the past hour.    Prenatal complications: no prenatal complications Prenatal Complications - Diabetes: none.    Dilation: 5 Effacement (%): 90 Station: -2 Exam by:: n druebbisch rn Blood pressure 128/83, pulse 91, temperature 98 F (36.7 C),  temperature source Oral, resp. rate 15, height 5\' 5"  (1.651 m), weight 190 lb (86.2 kg), last menstrual period 10/30/2016, SpO2 99 %. Maternal Exam:  Uterine Assessment: Contraction strength is moderate.  Contraction frequency is regular.   Abdomen: Patient reports no abdominal tenderness. Fetal presentation: vertex  Introitus: Normal vulva. Normal vagina.  Ferning test: not done.  Nitrazine test: not done. Amniotic fluid character: not assessed.  Pelvis: adequate for delivery.   Cervix: Cervix evaluated by digital exam.     Fetal Exam Fetal Monitor Review: Mode: ultrasound.   Variability: moderate (6-25 bpm).   Pattern: accelerations present.    Fetal State Assessment: Category I - tracings are normal.     Physical Exam  Constitutional: She is oriented to person, place, and time. She appears well-developed and well-nourished.  HENT:  Head: Normocephalic.  Eyes: Pupils are equal, round, and reactive to light.  Neck: Normal range of motion.  Cardiovascular: Normal rate, regular rhythm, normal heart sounds and intact distal pulses.  Respiratory: Effort normal and breath sounds normal.  GI: Soft. Bowel sounds are normal.  Genitourinary: Vagina normal and uterus normal.  Musculoskeletal: Normal range of motion.  Neurological: She is alert and oriented to person, place, and time. She has normal reflexes.  Skin: Skin is warm and dry.  Psychiatric: She has a normal mood and affect. Her behavior is normal. Judgment and thought content normal.    Prenatal labs: ABO, Rh: O/Positive/-- (06/28 1529) Antibody: Negative (06/28 1529) Rubella: 5.55 (06/28 1529) RPR: Non Reactive (10/11 1155)  HBsAg: Negative (06/28  1529)  HIV: Non Reactive (10/11 1155)  GBS:     Assessment/Plan: Active labor Stable maternal fetal unit GBS pos GBS treatment admit   Wyvonnia DuskyMarie Lawson 07/28/2017, 9:38 AM

## 2017-07-28 NOTE — Progress Notes (Signed)
Colleen Nichols is a 22 y.o. G2P1001 at 5066w4d by ultrasound admitted for active labor  Subjective:   Objective: BP 121/65   Pulse 84   Temp 98 F (36.7 C) (Oral)   Resp 18   Ht 5\' 5"  (1.651 m)   Wt 190 lb (86.2 kg)   LMP 10/30/2016 (Exact Date)   SpO2 99%   BMI 31.62 kg/m  No intake/output data recorded. No intake/output data recorded.  FHT:  FHR: 125-130 bpm, variability: moderate,  accelerations:  Present,  decelerations:  Absent UC:   regular, every 3 minutes SVE:   Dilation: 6 Effacement (%): 100 Station: -2 Exam by:: D Johnmark Geiger CNM  Labs: Lab Results  Component Value Date   WBC 7.8 07/28/2017   HGB 13.0 07/28/2017   HCT 38.7 07/28/2017   MCV 85.6 07/28/2017   PLT 143 (L) 07/28/2017    Assessment / Plan: Spontaneous labor, progressing normally  Labor: Progressing normally Preeclampsia:  no signs or symptoms of toxicity Fetal Wellbeing:  Category I Pain Control:  Iv pain meds I/D:  n/a Anticipated MOD:  NSVD  Wyvonnia DuskyMarie Wednesday Ericsson 07/28/2017, 1:22 PM

## 2017-07-29 ENCOUNTER — Encounter: Payer: Self-pay | Admitting: Certified Nurse Midwife

## 2017-07-29 ENCOUNTER — Ambulatory Visit: Payer: Self-pay

## 2017-07-29 LAB — RPR: RPR: NONREACTIVE

## 2017-07-29 MED ORDER — IBUPROFEN 600 MG PO TABS: 600.0000 mg | ORAL_TABLET | Freq: Four times a day (QID) | ORAL | 0 refills | Status: DC

## 2017-07-29 NOTE — Lactation Note (Signed)
This note was copied from a baby's chart. Lactation Consultation Note  LC called to room by RN to provide pump rental to mom.  WIC certified today and pump rental was discussed this afternoon.  When LC entered the room mom was bottle feeding infant formula.  Mom states he is just really fussy after his circumcision.  LC offers to assist mom with getting infant to the breast.  Mom somewhat reluctantly takes infants bottle away and LC assisted mom in positioning in football hold.  Mom has short shaft nipples that evert with hand expression.  Mom states she doesn't know how to hand express.  LC reviews steps and colostrum easily seen when expressed.  LC teaches mom the importance of supporting infants head and bringing him to the breast when gape is wide and to sandwich breast with other hand.  After a few attempts and teacup hold, infant latches.  LC teaches mom how to hold the breast when infant feeds.  Massage and compression  Are encouraged.   Mom states she pumped with her last child, now [age] years old.  She would like to pump her milk and bottle feed some.  She states he is feeding okay.  Infant was not vigourous at breast due to the formula 13ml he just took in previously.  LC reviewed BF basics and reviewed engorgement prevention and care.  Mom was given information about LC op services and also about support groups available.  Mom seems very distracted and maternal grandmother took infant after feed and feed him more formula then placed him in the carseat.  Family appeared ready to go home.  LC reviewed pump set up, pump parts and cleaning, storage, and provided mom with pump, money received.  Mom knows to return pump by 08/09/17.  Patient Name: Colleen Nichols: 07/29/2017 Reason for consult: Initial assessment   Maternal Data Has patient been taught Hand Expression?: Yes Does the patient have breastfeeding experience prior to this delivery?: Yes  Feeding Feeding Type:  Breast Fed Nipple Type: Slow - flow Length of feed: 5 min  LATCH Score Latch: Repeated attempts needed to sustain latch, nipple held in mouth throughout feeding, stimulation needed to elicit sucking reflex.  Audible Swallowing: A few with stimulation  Type of Nipple: Everted at rest and after stimulation  Comfort (Breast/Nipple): Filling, red/small blisters or bruises, mild/mod discomfort(slightly red tip of nipple on left side)  Hold (Positioning): Assistance needed to correctly position infant at breast and maintain latch.  LATCH Score: 6  Interventions Interventions: Breast feeding basics reviewed;Assisted with latch;Skin to skin;Breast massage;Hand express;Breast compression;Adjust position;DEBP;Support pillows;Position options  Lactation Tools Discussed/Used WIC Program: Yes(certified today) Pump Review: Setup, frequency, and cleaning;Milk Storage Initiated by:: [name] initiated:: 07/29/17   Consult Status Consult Status: Complete(LC was called into room by RN to provide mom with WIC loaner at pt. DC; )    [name] 07/29/2017, 8:33 PM

## 2017-07-29 NOTE — Progress Notes (Signed)
Post Partum Day 1 Subjective: no complaints, up ad lib, voiding, tolerating PO and + flatus  Objective: Blood pressure 118/61, pulse 90, temperature 98.1 F (36.7 C), temperature source Oral, resp. rate 18, height 5\' 5"  (1.651 m), weight 190 lb (86.2 kg), last menstrual period 10/30/2016, SpO2 100 %, unknown if currently breastfeeding.  Physical Exam:  General: alert, cooperative and no distress Lochia: appropriate Uterine Fundus: firm Incision: n/a DVT Evaluation: No evidence of DVT seen on physical exam.  Recent Labs    07/28/17 0931  HGB 13.0  HCT 38.7    Assessment/Plan: Plan for discharge tomorrow, Breastfeeding and Lactation consult   LOS: 1 day   Rolm BookbinderCaroline M Neill CNM 07/29/2017, 6:43 AM

## 2017-07-29 NOTE — Discharge Instructions (Signed)

## 2017-07-29 NOTE — Progress Notes (Signed)
   PRENATAL VISIT NOTE  Subjective:  Colleen Nichols is a 22 y.o. G2P2002 at 8813w6d being seen today for ongoing prenatal care.  She is currently monitored for the following issues for this low-risk pregnancy and has Encounter for supervision of normal pregnancy, unspecified, unspecified trimester; Sickle cell trait (HCC); Low vitamin D level; Positive GBS test; Indigestion; and Normal labor and delivery on their problem list.  Patient reports no complaints.  Contractions: Irregular. Vag. Bleeding: None.  Movement: Present. Denies leaking of fluid.   The following portions of the patient's history were reviewed and updated as appropriate: allergies, current medications, past family history, past medical history, past social history, past surgical history and problem list. Problem list updated.  Objective:   Vitals:   07/25/17 1508  BP: 114/74  Pulse: 94  Weight: 190 lb 3.2 oz (86.3 kg)    Fetal Status: Fetal Heart Rate (bpm): 136; doppler Fundal Height: 41 cm Movement: Present  Presentation: Vertex  General:  Alert, oriented and cooperative. Patient is in no acute distress.  Skin: Skin is warm and dry. No rash noted.   Cardiovascular: Normal heart rate noted  Respiratory: Normal respiratory effort, no problems with respiration noted  Abdomen: Soft, gravid, appropriate for gestational age.  Pain/Pressure: Present     Pelvic: Cervical exam performed Dilation: 1.5 Effacement (%): 30 Station: -3  Extremities: Normal range of motion.  Edema: Trace  Mental Status:  Normal mood and affect. Normal behavior. Normal judgment and thought content.   Assessment and Plan:  Pregnancy: G2P2002 at 4364w4d  1. Supervision of other normal pregnancy, antepartum     Doing well  2. Low vitamin D level     Taking weekly vitamin D  3. Positive GBS test     PCN for labor/delivery  Term labor symptoms and general obstetric precautions including but not limited to vaginal bleeding, contractions,  leaking of fluid and fetal movement were reviewed in detail with the patient. Please refer to After Visit Summary for other counseling recommendations.  Return in about 1 week (around 08/01/2017) for ROB.   Roe Coombsachelle A Enijah Furr, CNM

## 2017-07-29 NOTE — Discharge Summary (Signed)
OB Discharge Summary     Patient Name: Colleen Nichols DOB: 07-12-1996 MRN: [id number]  Date of admission: 07/28/2017 Delivering MD: Wyvonnia Dusky D   Date of discharge: 07/29/2017  Admitting diagnosis: 39wks CTX 5 to 6 mins Intrauterine pregnancy: [redacted]w[redacted]d     Secondary diagnosis:  Principal Problem:   SVD (spontaneous vaginal delivery) Active Problems:   Positive GBS test  Additional problems: sickle cell trait, low vit D     Discharge diagnosis: Term Pregnancy Delivered                                                                                                Post partum procedures:none  Augmentation: none  Complications: None  Hospital course:  Onset of Labor With Vaginal Delivery     22 y.o. yo [id number] at [redacted]w[redacted]d was admitted in Active Labor on 07/28/2017. Patient had an uncomplicated labor course as follows:  Membrane Rupture Time/Date: 1:53 PM ,07/28/2017   Intrapartum Procedures: Episiotomy: None [1]                                         Lacerations:  None [1]  Patient had a delivery of a Viable infant. 07/28/2017  Information for the patient's newborn:  Sumner, Boesch [[id number]]       Pateint had an uncomplicated postpartum course.  She is ambulating, tolerating a regular diet, passing flatus, and urinating well. Patient is discharged home in stable condition on 07/29/17.   Physical exam  Vitals:   07/28/17 1645 07/28/17 1723 07/28/17 1840 07/29/17 1817  BP: 121/68 114/66 118/61 132/71  Pulse: 68 78 90 84  Resp: 18 18 18 18   Temp:  97.7 F (36.5 C) 98.1 F (36.7 C) 98.4 F (36.9 C)  TempSrc:  Oral Oral Oral  SpO2:  100% 100%   Weight:      Height:       Exam performed by Cleone Slim, CNM on date of discharge General: alert, cooperative and no distress Lochia: appropriate Uterine Fundus: firm Incision: N/A DVT Evaluation: No evidence of DVT seen on physical exam. Labs: Lab Results  Component Value Date   WBC 7.8 07/28/2017    HGB 13.0 07/28/2017   HCT 38.7 07/28/2017   MCV 85.6 07/28/2017   PLT 143 (L) 07/28/2017   CMP Latest Ref Rng & Units 12/07/2016  Glucose 65 - 99 mg/dL 79  BUN 6 - 20 mg/dL 9  Creatinine 1.30 - 8.65 mg/dL 7.84  Sodium 696 - 295 mmol/L 134(L)  Potassium 3.5 - 5.1 mmol/L 3.6  Chloride 101 - 111 mmol/L 103  CO2 22 - 32 mmol/L 25  Calcium 8.9 - 10.3 mg/dL 9.6  Total Protein 6.5 - 8.1 g/dL 6.9  Total Bilirubin 0.3 - 1.2 mg/dL 2.8(U)  Alkaline Phos 38 - 126 U/L 56  AST 15 - 41 U/L 21  ALT 14 - 54 U/L 14    Discharge instruction: per After Visit Summary and "Baby and Me Booklet".  After visit meds:  Allergies as of 07/29/2017   No Known Allergies     Medication List    TAKE these medications   albuterol 108 (90 Base) MCG/ACT inhaler Commonly known as:  PROVENTIL HFA;VENTOLIN HFA Inhale 1-2 puffs into the lungs every 6 (six) hours as needed for wheezing or shortness of breath.   ibuprofen 600 MG tablet Commonly known as:  ADVIL,MOTRIN Take 1 tablet (600 mg total) by mouth every 6 (six) hours.   PRENATE PIXIE 10-0.6-0.4-200 MG Caps Take 1 tablet by mouth daily.       Diet: routine diet  Activity: Advance as tolerated. Pelvic rest for 6 weeks.   Outpatient follow up:4 weeks Follow up Appt: Future Appointments  Date Time Provider Department Center  08/26/2017  1:30 PM Orvilla Cornwallenney, Rachelle A, CNM [facility] None   Follow up Visit:No Follow-up on file.  Follow-up Information    [facility]             . Schedule an appointment as soon as possible for a visit in 4 week(s).   Why:  Please follow up in 4 weeks for post partum check Contact information: [place]           Postpartum contraception: Depo Provera  Newborn Data: Live born female  Birth Weight: 10 lb 5.1 oz (4680 g) APGAR: 8, 9  Newborn Delivery   Birth date/time:  07/28/2017 15:22:00 Delivery type:  Vaginal, Spontaneous     Baby Feeding: Breast Disposition:home with  mother   07/29/2017 Oralia ManisSherin Abraham, DO  OB FELLOW DISCHARGE ATTESTATION  I have seen and examined this patient and agree with above documentation in the resident's note.   Frederik PearJulie P Degele, MD OB Fellow 7:49 PM

## 2017-08-01 ENCOUNTER — Encounter: Payer: Medicaid Other | Admitting: Certified Nurse Midwife

## 2017-08-26 ENCOUNTER — Encounter: Payer: Self-pay | Admitting: Certified Nurse Midwife

## 2017-08-26 ENCOUNTER — Ambulatory Visit (INDEPENDENT_AMBULATORY_CARE_PROVIDER_SITE_OTHER): Payer: Medicaid Other | Admitting: Certified Nurse Midwife

## 2017-08-26 VITALS — BP 135/89 | HR 90 | Wt 172.0 lb

## 2017-08-26 DIAGNOSIS — Z1389 Encounter for screening for other disorder: Secondary | ICD-10-CM

## 2017-08-26 DIAGNOSIS — Z3042 Encounter for surveillance of injectable contraceptive: Secondary | ICD-10-CM

## 2017-08-26 MED ORDER — MEDROXYPROGESTERONE ACETATE 150 MG/ML IM SUSP
150.0000 mg | Freq: Once | INTRAMUSCULAR | Status: AC
  Administered 2017-08-26: 150 mg via INTRAMUSCULAR

## 2017-08-26 NOTE — Progress Notes (Signed)
Post Partum Exam  Colleen JourneyShakaydra Nichols is a 22 y.o. 332P2002 female who presents for a postpartum visit. She is 4 weeks postpartum following a spontaneous vaginal delivery. I have fully reviewed the prenatal and intrapartum course. The delivery was at 39 gestational weeks.  Anesthesia: none. Postpartum course has been unremarkable. Baby's course has been unmarkable. Baby is feeding by bottle - Gerber Gentle. Bleeding no bleeding. Bowel function is normal. Bladder function is normal. Patient is sexually active. Contraception method is none. Postpartum depression screening:neg  The following portions of the patient's history were reviewed and updated as appropriate: allergies, current medications, past family history, past medical history, past social history, past surgical history and problem list. Last pap smear done 01/10/17 and was Normal  Review of Systems Pertinent items noted in HPI and remainder of comprehensive ROS otherwise negative.    Objective:  Last menstrual period 10/30/2016, unknown if currently breastfeeding.  General:  alert, cooperative and no distress   Breasts:  inspection negative, no nipple discharge or bleeding, no masses or nodularity palpable  Lungs: clear to auscultation bilaterally  Heart:  regular rate and rhythm, S1, S2 normal, no murmur, click, rub or gallop  Abdomen: soft, non-tender; bowel sounds normal; no masses,  no organomegaly  Pelvic/Rectal Exam: Not performed.        Assessment:    Normal 4 week postpartum exam. Pap smear not done at today's visit.   Plan:   1. Contraception: Depo-Provera injections 2. Depo injections today 3. Follow up in: 3 months Depo injection or as needed.

## 2017-08-27 ENCOUNTER — Encounter: Payer: Self-pay | Admitting: Certified Nurse Midwife

## 2017-09-24 ENCOUNTER — Ambulatory Visit (INDEPENDENT_AMBULATORY_CARE_PROVIDER_SITE_OTHER): Payer: Medicaid Other | Admitting: Certified Nurse Midwife

## 2017-09-24 ENCOUNTER — Encounter: Payer: Self-pay | Admitting: Certified Nurse Midwife

## 2017-09-24 VITALS — BP 127/79 | HR 67 | Wt 180.8 lb

## 2017-09-24 DIAGNOSIS — N921 Excessive and frequent menstruation with irregular cycle: Secondary | ICD-10-CM

## 2017-09-24 MED ORDER — MEDROXYPROGESTERONE ACETATE 10 MG PO TABS: ORAL_TABLET | ORAL | 0 refills | Status: DC

## 2017-09-24 NOTE — Progress Notes (Signed)
Subjective:    Colleen Nichols is a 22 y.o. female who presents for contraception counseling. The patient has no complaints today. The patient is sexually active. Pertinent past medical history: none.  Postpartum, bottle feeding.  Had first Depo-provera injection on 08/27/17.  Reports light to moderate bleeding since getting her injection.  Discussed normalcy of irregular bleeding/spotting with injections.  Reports dark brown spotting. Denies any cramping.  Options given.  Wants to try Provera to stop the bleeding.  Desires to use OCPs if bleeding continues.    The information documented in the HPI was reviewed and verified.  Menstrual History: OB History    Gravida Para Term Preterm AB Living   2 2 2     2    SAB TAB Ectopic Multiple Live Births         0 2       Patient's last menstrual period was 10/30/2016 (exact date).   Patient Active Problem List   Diagnosis Date Noted  . Indigestion 03/07/2017  . Sickle cell trait (HCC) 01/15/2017  . Low vitamin D level 01/15/2017   Past Medical History:  Diagnosis Date  . Asthma     Past Surgical History:  Procedure Laterality Date  . WISDOM TOOTH EXTRACTION       Current Outpatient Medications:  .  albuterol (PROVENTIL HFA;VENTOLIN HFA) 108 (90 Base) MCG/ACT inhaler, Inhale 1-2 puffs into the lungs every 6 (six) hours as needed for wheezing or shortness of breath., Disp: , Rfl:  .  ibuprofen (ADVIL,MOTRIN) 600 MG tablet, Take 1 tablet (600 mg total) by mouth every 6 (six) hours. (Patient not taking: Reported on 09/24/2017), Disp: 30 tablet, Rfl: 0 .  medroxyPROGESTERone (PROVERA) 10 MG tablet, Take 20 mg Three times a day for 7 days, then take 20 mg daily for 3 weeks, Disp: 84 tablet, Rfl: 0 .  Prenat-FeAsp-Meth-FA-DHA w/o A (PRENATE PIXIE) 10-0.6-0.4-200 MG CAPS, Take 1 tablet by mouth daily. (Patient not taking: Reported on 09/24/2017), Disp: 30 capsule, Rfl: 12 No Known Allergies  Social History   Tobacco Use  . Smoking status:  Former Games developermoker  . Smokeless tobacco: Never Used  Substance Use Topics  . Alcohol use: Yes    Comment: occasional    Family History  Problem Relation Age of Onset  . Diabetes Maternal Grandmother   . Hypertension Maternal Grandmother   . Hypertension Paternal Grandmother        Review of Systems Constitutional: negative for weight loss Genitourinary:negative for abnormal menstrual periods and vaginal discharge      Objective:   BP 127/79   Pulse 67   Wt 180 lb 12.8 oz (82 kg)   LMP 10/30/2016 (Exact Date)   Breastfeeding? No   BMI 30.09 kg/m    General:   alert  PE deferred  Lab Review Urine pregnancy test Labs reviewed yes Radiologic studies reviewed no  50% of 20 min visit spent on counseling and coordination of care.    Assessment:    22 y.o., continuing Depo-Provera injections, no contraindications.  1. Breakthrough bleeding on depo provera     - medroxyPROGESTERone (PROVERA) 10 MG tablet; Take 20 mg Three times a day for 7 days, then take 20 mg daily for 3 weeks  Dispense: 84 tablet; Refill: 0  Plan:    All questions answered. Meds ordered this encounter  Medications  . medroxyPROGESTERone (PROVERA) 10 MG tablet    Sig: Take 20 mg Three times a day for 7 days, then take 20 mg  daily for 3 weeks    Dispense:  84 tablet    Refill:  0   OCPs if nusance bleeding continues.  Follow up as needed.

## 2017-09-24 NOTE — Progress Notes (Signed)
Pt states that she has had vaginal bleeding since 08-27-17. Pt received 1st depo injection on 08-26-17, and wants to switch to another method to control bleeding.

## 2017-11-19 ENCOUNTER — Ambulatory Visit (INDEPENDENT_AMBULATORY_CARE_PROVIDER_SITE_OTHER): Payer: Self-pay | Admitting: *Deleted

## 2017-11-19 ENCOUNTER — Telehealth: Payer: Self-pay

## 2017-11-19 VITALS — BP 136/83 | HR 83 | Wt 182.0 lb

## 2017-11-19 DIAGNOSIS — Z3042 Encounter for surveillance of injectable contraceptive: Secondary | ICD-10-CM

## 2017-11-19 MED ORDER — MEDROXYPROGESTERONE ACETATE 150 MG/ML IM SUSP: 150.0000 mg | INTRAMUSCULAR | 0 refills | Status: DC

## 2017-11-19 MED ORDER — MEDROXYPROGESTERONE ACETATE 150 MG/ML IM SUSP
150.0000 mg | Freq: Once | INTRAMUSCULAR | Status: AC
  Administered 2017-11-19: 150 mg via INTRAMUSCULAR

## 2017-11-19 NOTE — Telephone Encounter (Signed)
S/w pt and she stated that she is out of depo refills and has appt today, advised pt that rx can be sent but to schedule appt for annual.   Pt states that she may want to switch to pills or something cheaper in future due to, out-of-pocket pay.

## 2017-11-19 NOTE — Progress Notes (Signed)
Pt is in office for depo injection. Pt is on time for injection. Injection given, pt tolerated well. Pt advised to RTO July 23-Aug 6.  Pt states that she is still having irregular bleeding with Depo.  Pt states she is currently taking a pill pack as well to help with bleeding.   Pt advised to continue taking, will review with provider for further recommendations. Pt advised to contact office if bleeding gets worse.   BP 136/83   Pulse 83   Wt 182 lb (82.6 kg)   BMI 30.29 kg/m   Administrations This Visit    medroxyPROGESTERone (DEPO-PROVERA) injection 150 mg    Admin Date 11/19/2017 Action Given Dose 150 mg Route Intramuscular Administered By Lanney Gins, CMA

## 2017-11-21 ENCOUNTER — Other Ambulatory Visit: Payer: Self-pay | Admitting: Certified Nurse Midwife

## 2017-11-21 ENCOUNTER — Telehealth: Payer: Self-pay | Admitting: *Deleted

## 2017-11-21 DIAGNOSIS — N939 Abnormal uterine and vaginal bleeding, unspecified: Secondary | ICD-10-CM

## 2017-11-21 MED ORDER — NORETHIN-ETH ESTRAD-FE BIPHAS 1 MG-10 MCG / 10 MCG PO TABS: 1.0000 | ORAL_TABLET | Freq: Every day | ORAL | 4 refills | Status: DC

## 2017-11-21 NOTE — Progress Notes (Signed)
I have reviewed the chart and agree with nursing staff's documentation of this patient's encounter.  Roe Coombs, CNM 11/21/2017 3:43 PM   Colleen Nichols was sent to the pharmacy for her to try for the spotting with Depo injections.

## 2017-11-21 NOTE — Telephone Encounter (Signed)
Call placed to pt to make aware of Rx sent by provider. Pt advised to contact office if any others problems with bleeding or if she chooses to change BC.

## 2017-11-21 NOTE — Telephone Encounter (Signed)
-----   Message from Roe Coombs, CNM sent at 11/21/2017  3:29 PM EDT ----- Regarding: RE: for review/recommend Please let her know that LoLo was sent for irregular bleeding with Depo. Thank you Rachelle   ----- Message ----- From: Lanney Gins, CMA Sent: 11/19/2017   4:35 PM To: Roe Coombs, CNM Subject: for review/recommend                           Please see note for further recommendations

## 2018-02-10 ENCOUNTER — Other Ambulatory Visit: Payer: Self-pay

## 2018-02-10 ENCOUNTER — Ambulatory Visit (INDEPENDENT_AMBULATORY_CARE_PROVIDER_SITE_OTHER): Payer: Self-pay

## 2018-02-10 DIAGNOSIS — Z3042 Encounter for surveillance of injectable contraceptive: Secondary | ICD-10-CM

## 2018-02-10 MED ORDER — MEDROXYPROGESTERONE ACETATE 150 MG/ML IM SUSP
150.0000 mg | Freq: Once | INTRAMUSCULAR | Status: AC
  Administered 2018-02-10: 150 mg via INTRAMUSCULAR

## 2018-02-10 MED ORDER — MEDROXYPROGESTERONE ACETATE 150 MG/ML IM SUSP: 150.0000 mg | INTRAMUSCULAR | 0 refills | Status: DC

## 2018-02-10 NOTE — Progress Notes (Signed)
Pt is in office for depo injection. Pt is on time for injection. Injection given, pt tolerated well. Pt advised to return for depo 10/14-10/28.

## 2018-03-31 ENCOUNTER — Other Ambulatory Visit: Payer: Self-pay | Admitting: Family Medicine

## 2018-04-21 ENCOUNTER — Other Ambulatory Visit: Payer: Self-pay

## 2018-04-21 DIAGNOSIS — Z3042 Encounter for surveillance of injectable contraceptive: Secondary | ICD-10-CM

## 2018-04-21 MED ORDER — MEDROXYPROGESTERONE ACETATE 150 MG/ML IM SUSP: 150.0000 mg | INTRAMUSCULAR | 2 refills | Status: DC

## 2018-05-06 ENCOUNTER — Ambulatory Visit (INDEPENDENT_AMBULATORY_CARE_PROVIDER_SITE_OTHER): Payer: Self-pay | Admitting: *Deleted

## 2018-05-06 VITALS — BP 150/54 | HR 67 | Wt 194.0 lb

## 2018-05-06 DIAGNOSIS — Z3042 Encounter for surveillance of injectable contraceptive: Secondary | ICD-10-CM

## 2018-05-06 MED ORDER — MEDROXYPROGESTERONE ACETATE 150 MG/ML IM SUSP
150.0000 mg | Freq: Once | INTRAMUSCULAR | Status: AC
  Administered 2018-05-06: 150 mg via INTRAMUSCULAR

## 2018-05-06 NOTE — Progress Notes (Signed)
Pt in office for depo injection today. Pt is on time for depo. Pt tolerated injection well.  Pt has no other concerns today. Pt advised to RTO Jan 7-21 for next depo.  BP (!) 150/54   Pulse 67   Wt 194 lb (88 kg)   BMI 32.28 kg/m   Administrations This Visit    medroxyPROGESTERone (DEPO-PROVERA) injection 150 mg    Admin Date 05/06/2018 Action Given Dose 150 mg Route Intramuscular Administered By Lanney Gins, CMA

## 2018-07-28 ENCOUNTER — Ambulatory Visit: Payer: Self-pay
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
## 2018-08-17 IMAGING — US US OB TRANSVAGINAL
1 series · 15 of 28 positions shown · non-contrast
Comparison: 12/07/2016

CLINICAL DATA: Follow up viability

EXAM:
TRANSVAGINAL OB ULTRASOUND
TECHNIQUE: Transvaginal ultrasound was performed for complete evaluation of the
gestation as well as the maternal uterus, adnexal regions, and
pelvic cul-de-sac.

[Series 1: us ob transvaginal · 40 acquisitions, 15 frames shown]
[im 1/40]
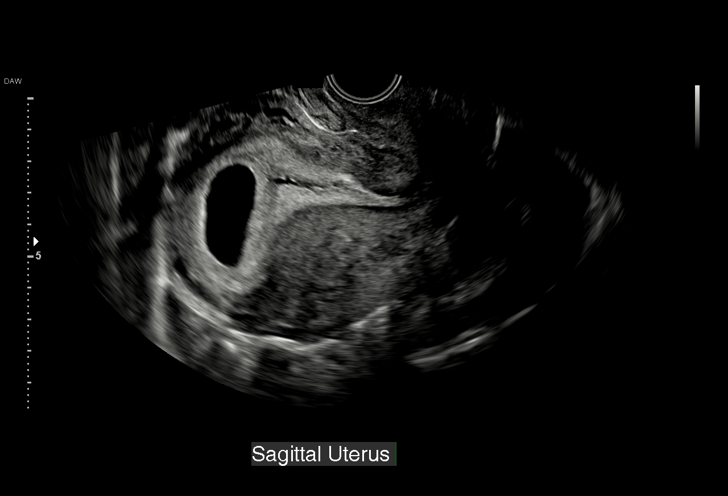
[im 3/40]
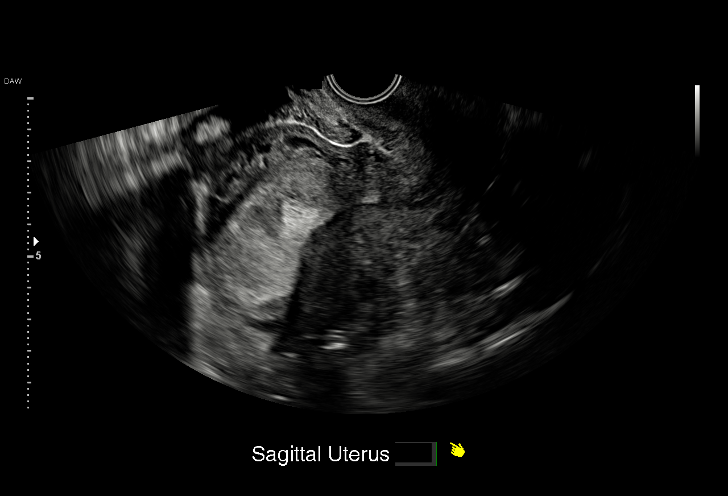
[im 6/40]
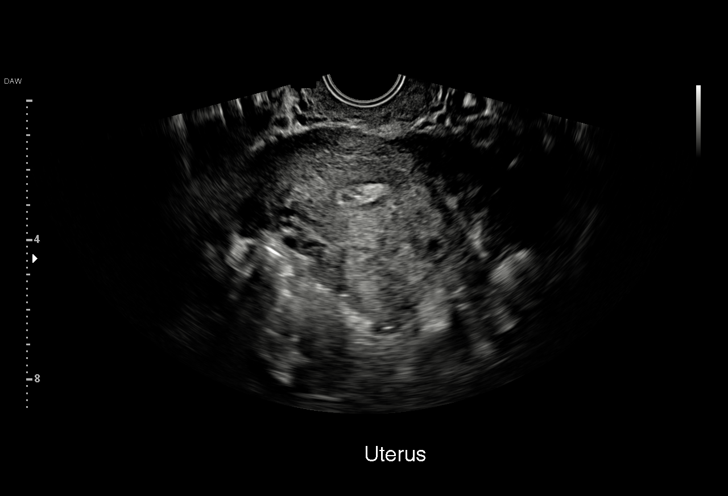
[im 9/40]
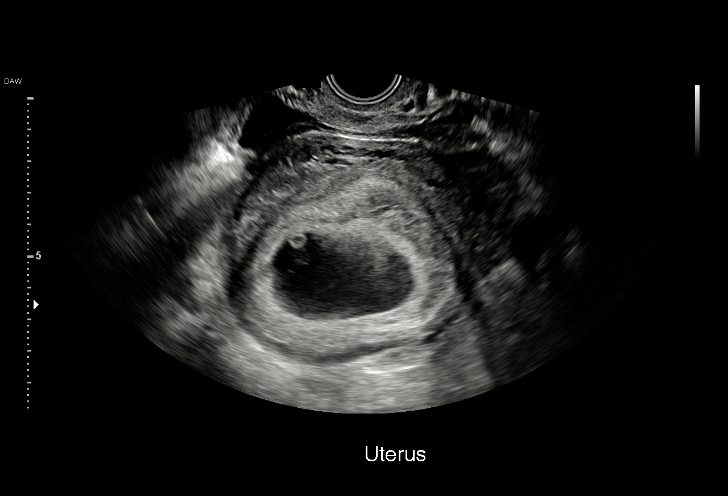
[im 12/40]
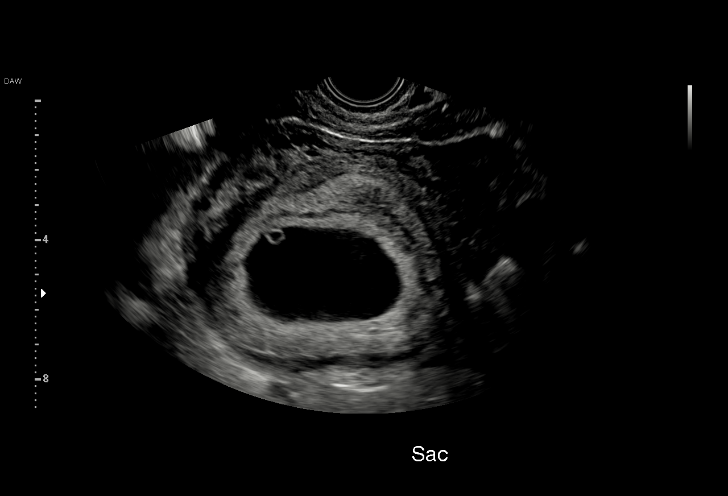
[im 15/40]
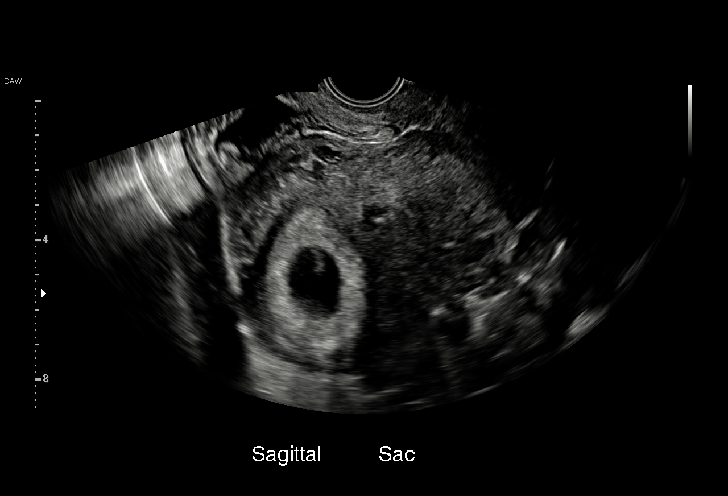
[im 18/40]
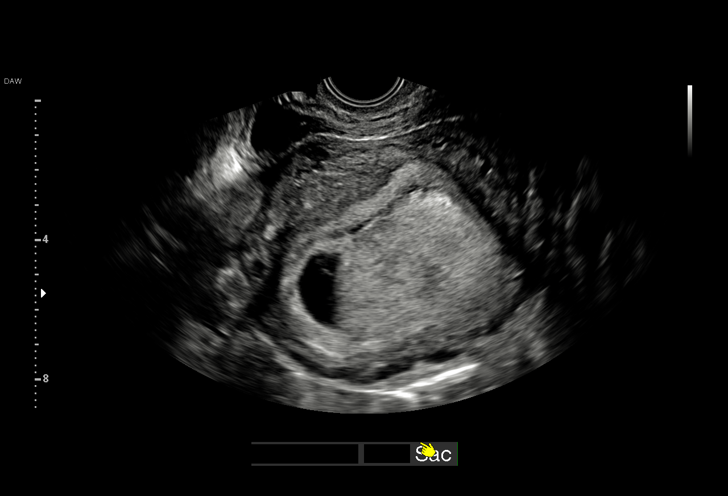
[im 21/40]
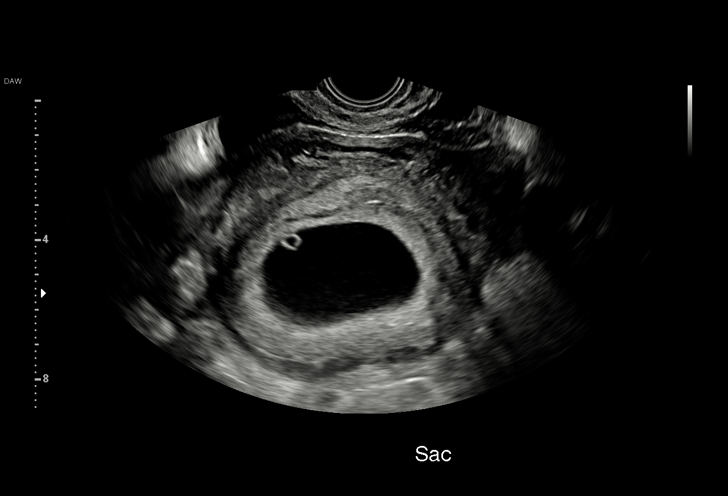
[im 22/40]
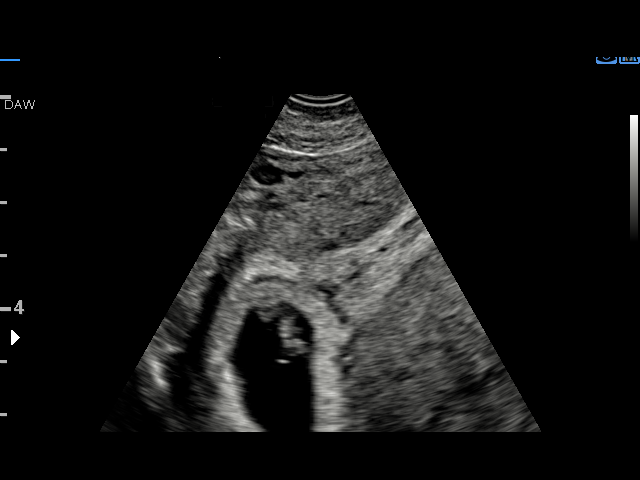
[im 25/40]
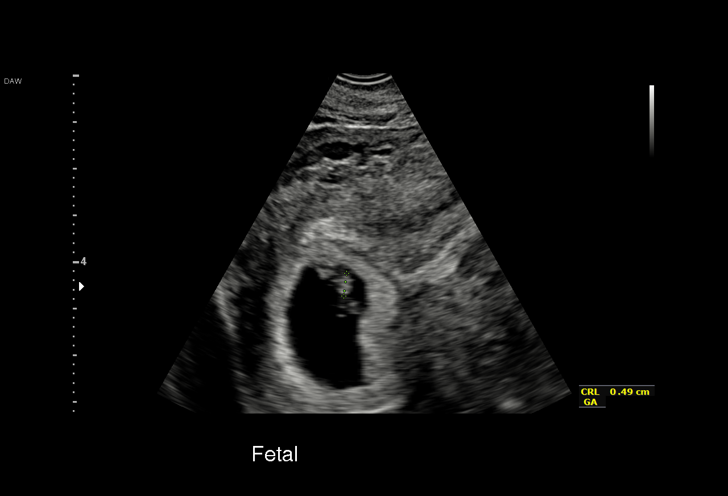
[im 28/40]
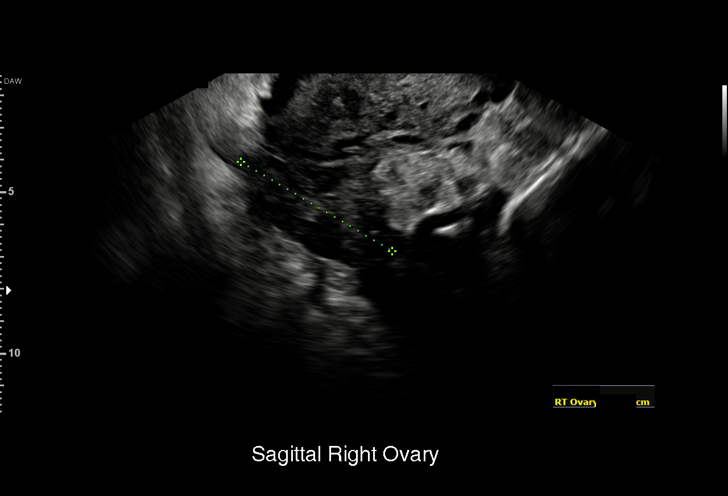
[im 31/40]
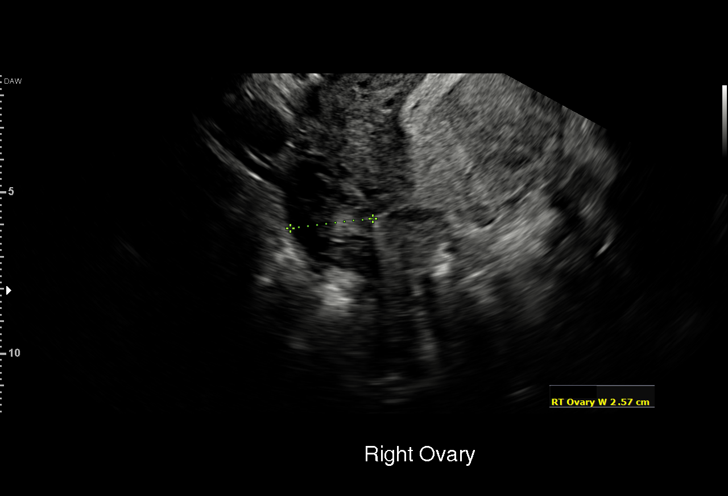
[im 34/40]
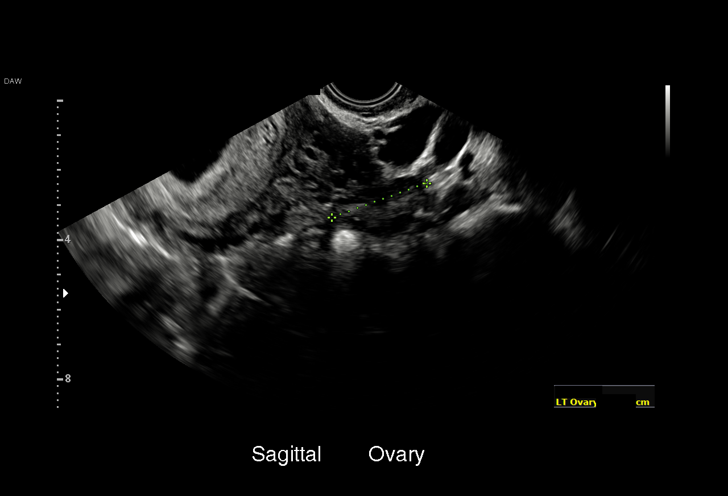
[im 37/40]
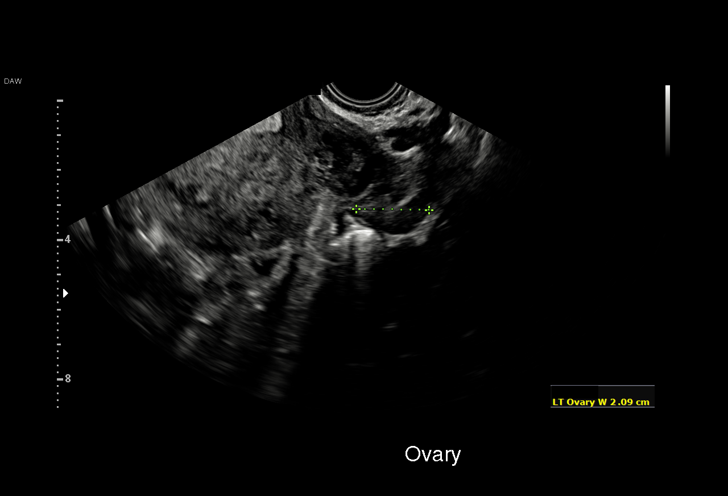
[im 40/40]
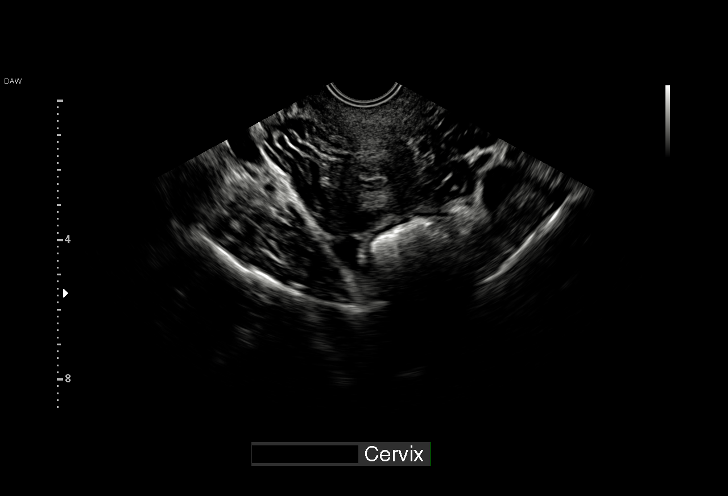

[15 of 28 positions shown; findings below may reference images not displayed]

FINDINGS: Intrauterine gestational sac: Visualized

Yolk sac:  Visualized

Embryo:  Visualized

Cardiac Activity: Visualized

Heart Rate: 146 bpm

CRL:   6.1  mm   6 w 3 d                  US EDC: 08/10/2017

Subchorionic hemorrhage:  None visualized.

Maternal uterus/adnexae: Ovaries are within normal limits. The right
ovary measures 5.4 x 3.4 x 2.6 cm. The left ovary measures 2.9 x
x 2.1 cm. Trace free fluid in the cul-de-sac.
IMPRESSION: Single viable intrauterine gestation as described above. Trace free
fluid in the cul-de-sac.

## 2018-12-03 ENCOUNTER — Inpatient Hospital Stay (HOSPITAL_COMMUNITY)
Admission: AD | Admit: 2018-12-03 | Discharge: 2018-12-03 | Disposition: A | Discharge status: Home / Self Care | Payer: Medicaid Other | Attending: Obstetrics and Gynecology | Admitting: Obstetrics and Gynecology

## 2018-12-03 ENCOUNTER — Inpatient Hospital Stay (HOSPITAL_COMMUNITY): Payer: Medicaid Other

## 2018-12-03 ENCOUNTER — Encounter (HOSPITAL_COMMUNITY): Payer: Self-pay

## 2018-12-03 ENCOUNTER — Other Ambulatory Visit: Payer: Self-pay

## 2018-12-03 ENCOUNTER — Encounter (HOSPITAL_COMMUNITY): Payer: Self-pay | Admitting: Obstetrics and Gynecology

## 2018-12-03 ENCOUNTER — Emergency Department (HOSPITAL_COMMUNITY)
Admission: EM | Admit: 2018-12-03 | Discharge: 2018-12-03 | Disposition: A | Discharge status: Home / Self Care | Payer: Medicaid Other | Attending: Emergency Medicine | Admitting: Emergency Medicine

## 2018-12-03 DIAGNOSIS — Z792 Long term (current) use of antibiotics: Secondary | ICD-10-CM | POA: Diagnosis not present

## 2018-12-03 DIAGNOSIS — W57XXXA Bitten or stung by nonvenomous insect and other nonvenomous arthropods, initial encounter: Secondary | ICD-10-CM | POA: Diagnosis not present

## 2018-12-03 DIAGNOSIS — M545 Low back pain: Secondary | ICD-10-CM | POA: Insufficient documentation

## 2018-12-03 DIAGNOSIS — Z79899 Other long term (current) drug therapy: Secondary | ICD-10-CM | POA: Insufficient documentation

## 2018-12-03 DIAGNOSIS — R102 Pelvic and perineal pain: Secondary | ICD-10-CM | POA: Diagnosis not present

## 2018-12-03 DIAGNOSIS — O3680X0 Pregnancy with inconclusive fetal viability, not applicable or unspecified: Secondary | ICD-10-CM | POA: Insufficient documentation

## 2018-12-03 DIAGNOSIS — O9989 Other specified diseases and conditions complicating pregnancy, childbirth and the puerperium: Secondary | ICD-10-CM | POA: Insufficient documentation

## 2018-12-03 DIAGNOSIS — O26899 Other specified pregnancy related conditions, unspecified trimester: Secondary | ICD-10-CM

## 2018-12-03 DIAGNOSIS — J45909 Unspecified asthma, uncomplicated: Secondary | ICD-10-CM | POA: Insufficient documentation

## 2018-12-03 DIAGNOSIS — Z3A01 Less than 8 weeks gestation of pregnancy: Secondary | ICD-10-CM | POA: Insufficient documentation

## 2018-12-03 DIAGNOSIS — O26891 Other specified pregnancy related conditions, first trimester: Secondary | ICD-10-CM

## 2018-12-03 DIAGNOSIS — O99511 Diseases of the respiratory system complicating pregnancy, first trimester: Secondary | ICD-10-CM | POA: Insufficient documentation

## 2018-12-03 DIAGNOSIS — Z87891 Personal history of nicotine dependence: Secondary | ICD-10-CM | POA: Insufficient documentation

## 2018-12-03 DIAGNOSIS — Y939 Activity, unspecified: Secondary | ICD-10-CM | POA: Diagnosis not present

## 2018-12-03 DIAGNOSIS — Z3201 Encounter for pregnancy test, result positive: Secondary | ICD-10-CM | POA: Diagnosis not present

## 2018-12-03 DIAGNOSIS — Y999 Unspecified external cause status: Secondary | ICD-10-CM | POA: Insufficient documentation

## 2018-12-03 DIAGNOSIS — Y929 Unspecified place or not applicable: Secondary | ICD-10-CM | POA: Diagnosis not present

## 2018-12-03 DIAGNOSIS — S80861A Insect bite (nonvenomous), right lower leg, initial encounter: Secondary | ICD-10-CM | POA: Diagnosis not present

## 2018-12-03 LAB — WET PREP, GENITAL
Clue Cells Wet Prep HPF POC: NONE SEEN
Sperm: NONE SEEN
Trich, Wet Prep: NONE SEEN
Yeast Wet Prep HPF POC: NONE SEEN

## 2018-12-03 LAB — HCG, QUANTITATIVE, PREGNANCY: hCG, Beta Chain, Quant, S: 6423 m[IU]/mL — ABNORMAL HIGH (ref <5)

## 2018-12-03 LAB — CBC
HCT: 38.9 % (ref 36.0–46.0)
Hemoglobin: 13.3 g/dL (ref 12.0–15.0)
MCH: 28.7 pg (ref 26.0–34.0)
MCHC: 34.2 g/dL (ref 30.0–36.0)
MCV: 84 fL (ref 80.0–100.0)
Platelets: 200 10*3/uL (ref 150–400)
RBC: 4.63 MIL/uL (ref 3.87–5.11)
RDW: 12.7 % (ref 11.5–15.5)
WBC: 7.9 10*3/uL (ref 4.0–10.5)
nRBC: 0 % (ref 0.0–0.2)

## 2018-12-03 LAB — I-STAT BETA HCG BLOOD, ED (MC, WL, AP ONLY): I-stat hCG, quantitative: >2000 m[IU]/mL — ABNORMAL HIGH (ref <5)

## 2018-12-03 MED ORDER — CEPHALEXIN 500 MG PO CAPS: 500.0000 mg | ORAL_CAPSULE | Freq: Two times a day (BID) | ORAL | 0 refills | Status: AC

## 2018-12-03 MED ORDER — PRENATAL COMPLETE 14-0.4 MG PO TABS: ORAL_TABLET | ORAL | 3 refills | Status: AC

## 2018-12-03 NOTE — ED Provider Notes (Signed)
[facility]-EMERGENCY DEPT Provider Note   CSN: [id number] Arrival date & time: 12/03/18  1240  History   Chief Complaint Chief Complaint  Patient presents with  . Possible Pregnancy  . Rash   HPI Colleen Nichols is a 23 y.o. female with past medical history significant for asthma who presents for evaluation of 2 separate complaints.    Patient states that she took a home pregnancy test and was positive.  Her last menstrual cycle was in April, however she is unable to tell me the date. She is a G3, P2. Denies any abdominal pain, pelvic pain, vaginal discharge, vaginal bleeding, dysuria, nausea, vomiting.  She is not on a prenatal vitamin. States she would "just like to confirm pregnancy to get insurance."  Patient also with 3 areas of rash. Patient states she developed "bumps" on her bilateral legs.  Varying in size.  They are not tender to palpation and are red.  She denies fever, chills, CP, SOB, oral swelling, lower extremity swelling, warmth, decreased ROM, numbness or tingling to her extremities.  She does not have any known insect bites.  She has no history of MRSA infection or abscesses.  Has not taking anything for symptoms.  She rates this a 3/10.  Denies radiation.  History obtained from patient.  No interpreter was used.     HPI  Past Medical History:  Diagnosis Date  . Asthma     Patient Active Problem List   Diagnosis Date Noted  . Indigestion 03/07/2017  . Sickle cell trait (HCC) 01/15/2017  . Low vitamin D level 01/15/2017    Past Surgical History:  Procedure Laterality Date  . WISDOM TOOTH EXTRACTION       OB History    Gravida  2   Para  2   Term  2   Preterm      AB      Living  2     SAB      TAB      Ectopic      Multiple  0   Live Births  2            Home Medications    Prior to Admission medications   Medication Sig Start Date End Date Taking? Authorizing Provider  albuterol (PROVENTIL  HFA;VENTOLIN HFA) 108 (90 Base) MCG/ACT inhaler Inhale 1-2 puffs into the lungs every 6 (six) hours as needed for wheezing or shortness of breath.   Yes [provider]  ibuprofen (ADVIL) 200 MG tablet Take 400 mg by mouth every 6 (six) hours as needed for headache or moderate pain.   Yes [provider]  metroNIDAZOLE (FLAGYL PO) Take 1 tablet by mouth daily.   Yes [provider]  cephALEXin (KEFLEX) 500 MG capsule Take 1 capsule (500 mg total) by mouth 2 (two) times daily for 5 days. 12/03/18 12/08/18  Jachai Okazaki A, PA-C  ibuprofen (ADVIL,MOTRIN) 600 MG tablet Take 1 tablet (600 mg total) by mouth every 6 (six) hours. Patient not taking: Reported on 09/24/2017 07/29/17   Oralia Manis, DO  medroxyPROGESTERone (DEPO-PROVERA) 150 MG/ML injection Inject 1 mL (150 mg total) into the muscle every 3 (three) months. Patient not taking: Reported on 12/03/2018 04/21/18   Brock Bad, MD  medroxyPROGESTERone (PROVERA) 10 MG tablet Take 20 mg Three times a day for 7 days, then take 20 mg daily for 3 weeks Patient not taking: Reported on 12/03/2018 09/24/17   Roe Coombs, CNM  Norethindrone-Ethinyl  Estradiol-Fe Biphas (LO LOESTRIN FE) 1 MG-10 MCG / 10 MCG tablet Take 1 tablet by mouth daily. Take 1 tablet by mouth daily. Patient not taking: Reported on 12/03/2018 11/21/17   Orvilla Cornwall A, CNM  Prenat-FeAsp-Meth-FA-DHA w/o A (PRENATE PIXIE) 10-0.6-0.4-200 MG CAPS Take 1 tablet by mouth daily. Patient not taking: Reported on 09/24/2017 01/10/17   Roe Coombs, CNM  Prenatal Vit-Fe Fumarate-FA (PRENATAL COMPLETE) 14-0.4 MG TABS Take 1 pill a day. 12/03/18   Rishikesh Khachatryan A, PA-C    Family History Family History  Problem Relation Age of Onset  . Diabetes Maternal Grandmother   . Hypertension Maternal Grandmother   . Hypertension Paternal Grandmother     Social History Social History   Tobacco Use  . Smoking status: Former Games developer  . Smokeless tobacco:  Never Used  Substance Use Topics  . Alcohol use: Yes    Comment: occasional  . Drug use: No     Allergies   Patient has no known allergies.   Review of Systems Review of Systems  Constitutional: Negative.   HENT: Negative.   Respiratory: Negative.   Cardiovascular: Negative.   Gastrointestinal: Negative.   Genitourinary: Negative.   Musculoskeletal: Negative.   Skin: Positive for rash.  Neurological: Negative.   All other systems reviewed and are negative.  Physical Exam Updated Vital Signs BP 122/74   Pulse 89   Temp 98.8 F (37.1 C) (Oral)   Resp 16   Ht 5\' 5"  (1.651 m)   Wt 76.7 kg   LMP 10/22/2018 (Approximate)   SpO2 100%   BMI 28.12 kg/m   Physical Exam Vitals signs and nursing note reviewed.  Constitutional:      General: She is not in acute distress.    Appearance: She is well-developed. She is not ill-appearing, toxic-appearing or diaphoretic.  HENT:     Head: Normocephalic and atraumatic.     Nose: Nose normal.     Mouth/Throat:     Mouth: Mucous membranes are moist.     Pharynx: Oropharynx is clear.  Eyes:     Pupils: Pupils are equal, round, and reactive to light.  Neck:     Musculoskeletal: Normal range of motion.     Comments: No neck stiffness or neck rigidity. Cardiovascular:     Rate and Rhythm: Normal rate.     Pulses: Normal pulses.     Heart sounds: Normal heart sounds. No murmur. No friction rub. No gallop.   Pulmonary:     Effort: Pulmonary effort is normal. No respiratory distress.     Breath sounds: Normal breath sounds. No wheezing, rhonchi or rales.  Abdominal:     General: There is no distension.     Comments: Soft, non tender without rebound or guarding. Normoactive bowel sounds. Negative CVA tenderness  Musculoskeletal: Normal range of motion.     Right knee: Normal.     Left knee: Normal.     Right ankle: Normal.     Left ankle: Normal.     Right lower leg: Normal.     Left lower leg: Normal.     Right foot: Normal.      Left foot: Normal.     Comments: Moves all 4 extremities without difficulty. No lower extremity edema, echymosis or warmth. Lower extremity compartments are soft.  No tenderness to bilateral calves.  No obvious deformity.  Skin:    General: Skin is warm and dry.     Capillary Refill: Capillary refill takes less than  2 seconds.     Comments: 3 areas of erythema to lower extremities.  1 area to right shin measuring 2cm and rounded.  Mild induration without fluctuance.  No streaking.  No warmth.  1 cm area with erythema, no fluctuance or induration, streaking, warmth to left posterior foot.  2cm area of erythema without warmth, induration or fluctuance just distal to popliteal fossa on left lower extremity.  Neurological:     Mental Status: She is alert.            ED Treatments / Results  Labs (all labs ordered are listed, but only abnormal results are displayed) Labs Reviewed  I-STAT BETA HCG BLOOD, ED ([facility], [facility], [facility] ONLY) - Abnormal; Notable for the following components:      Result Value   I-stat hCG, quantitative >2,000.0 (*)    All other components within normal limits    EKG None  Radiology No results found.  Procedures Procedures (including critical care time)  EMERGENCY DEPARTMENT US SOFT TISSUE INTERPRETATION "Study: Limited Soft Tissue Ultrasound"  INDICATIONS: Soft tissue infection Multiple views of the body part were obtained in real-time with a multi-frequency linear probe  PERFORMED BY: Myself IMAGES ARCHIVED?: Yes SIDE:Right  BODY PART:Lower extremity INTERPRETATION:  Cellulitis present  Medications Ordered in ED Medications - No data to display  Initial Impression / Assessment and Plan / ED Course  I have reviewed the triage vital signs and the nursing notes.  Pertinent labs & imaging results that were available during my care of the patient were reviewed by me and considered in my medical decision making (see chart for details).  23 year-old  female otherwise well presents for evaluation 2 complaints.  Afebrile, nonseptic, non-ill-appearing.  No systemic symptoms. Patient with 3 areas of erythema over bilateral lower extremities.  She is unsure if she was bitten by anything.  No prior history of MRSA, cellulitis or abscess. Lower extremity compartments are soft. No mass, edema, warmth or swelling to bilateral legs.  Low suspicion for DVT.  She is ambulatory without difficulty.  Normal musculoskeletal exam.  Neurovascularly intact.  I have US all 3 areas. Area to right shin consistent with cobblestoning and cellulitis. No abscess. Normal US to 2 left areas. Will cover with Keflex as this is safe in pregnancy. Areas marked. No evidence of compartment syndrome. Patient denies any difficulty breathing or swallowing. No streaking.  Pt has a patent airway without stridor and is handling secretions without difficulty; no angioedema. No blisters, no pustules, no warmth, no draining sinus tracts, no superficial abscesses, no bullous impetigo, no vesicles, no desquamation, no target lesions with dusky purpura or a central bulla. Not tender to touch. No concern for superimposed infection. No concern for SJS, TEN, TSS, tick borne illness, syphilis or other life-threatening condition.   Patient also would likly confirmatory pregnancy test so she can apply for pregnancy Medicaid.  She is unable to tell me when her last menstrual cycle was.  G3P2. She states this could possibly been in April. No abd pain, vaginal pain, discharge or vaginal bleeding. Quant HCG >2000. Given no abdominal pain, pelvic pain, vaginal bleeding, vaginal discharge or any complaints have to do with pregnancy do not feel patient needs emergent US at this time. No concerns for STDs. Will dc home with prenatal vitamins and have her follow up with ObGyn. Given resources for followup. Low suspicion for ectopic, threatened miscarriage, PID,Torsion or TOA. Given no vaginal bleeding did not to give  Rhogam at this  time.   Patient is hemodynamically stable and in no acute distress.  Patient able to ambulate in department prior to ED.  Evaluation does not show acute pathology that would require ongoing or additional emergent interventions while in the emergency department or further inpatient treatment. I have discussed the diagnosis with the patient and answered all questions.  Patient has no further complaints prior to discharge.  Patient is comfortable with plan discussed in room and is stable for discharge at this time.  I have discussed strict return precautions for returning to the emergency department.  Patient was encouraged to follow-up with PCP/specialist refer to at discharge.     Final Clinical Impressions(s) / ED Diagnoses   Final diagnoses:  Positive blood pregnancy test  Bug bite with infection, initial encounter    ED Discharge Orders         Ordered    cephALEXin (KEFLEX) 500 MG capsule  2 times daily     12/03/18 1417    Prenatal Vit-Fe Fumarate-FA (PRENATAL COMPLETE) 14-0.4 MG TABS     12/03/18 1417           Kie Calvin A, PA-C 12/03/18 1454    Wynetta Fines, MD 12/04/18 [phone]

## 2018-12-03 NOTE — ED Triage Notes (Signed)
Pt reports she is pelvic and is unsure how far along she is as she cannot get in with an OB. Pt reports she wants to know how far along she is. Pt also reports she has bumps on leg. 3 obvious large areas of irritation noted on patient legs.

## 2018-12-03 NOTE — Discharge Instructions (Addendum)
Evaluated today for bug bite and pregnancy. You pregnancy test was positive. I have given you an antibiotic for a possible infection in your leg. Take as prescribed.

## 2018-12-03 NOTE — MAU Note (Signed)
Pt states that she was at the ER earlier and did not have pain.   Pt states that she started having abdominal and back pain around 1700.   Denies vaginal bleeding.

## 2018-12-03 NOTE — ED Notes (Signed)
I-STAT BETA RESULT WAS>2000 INFORMED THE PA  BRITNI

## 2018-12-03 NOTE — Discharge Instructions (Signed)
First Trimester of Pregnancy  The first trimester of pregnancy is from week 1 until the end of week 13 (months 1 through 3). A week after a sperm fertilizes an egg, the egg will implant on the wall of the uterus. This embryo will begin to develop into a baby. Genes from you and your partner will form the baby. The female genes will determine whether the baby will be a boy or a girl. At 6-8 weeks, the eyes and face will be formed, and the heartbeat can be seen on ultrasound. At the end of 12 weeks, all the baby's organs will be formed.  Now that you are pregnant, you will want to do everything you can to have a healthy baby. Two of the most important things are to get good prenatal care and to follow your health care provider's instructions. Prenatal care is all the medical care you receive before the baby's birth. This care will help prevent, find, and treat any problems during the pregnancy and childbirth.  Body changes during your first trimester  Your body goes through many changes during pregnancy. The changes vary from woman to woman.   You may gain or lose a couple of pounds at first.   You may feel sick to your stomach (nauseous) and you may throw up (vomit). If the vomiting is uncontrollable, call your health care provider.   You may tire easily.   You may develop headaches that can be relieved by medicines. All medicines should be approved by your health care provider.   You may urinate more often. Painful urination may mean you have a bladder infection.   You may develop heartburn as a result of your pregnancy.   You may develop constipation because certain hormones are causing the muscles that push stool through your intestines to slow down.   You may develop hemorrhoids or swollen veins (varicose veins).   Your breasts may begin to grow larger and become tender. Your nipples may stick out more, and the tissue that surrounds them (areola) may become darker.   Your gums may bleed and may be  sensitive to brushing and flossing.   Dark spots or blotches (chloasma, mask of pregnancy) may develop on your face. This will likely fade after the baby is born.   Your menstrual periods will stop.   You may have a loss of appetite.   You may develop cravings for certain kinds of food.   You may have changes in your emotions from day to day, such as being excited to be pregnant or being concerned that something may go wrong with the pregnancy and baby.   You may have more vivid and strange dreams.   You may have changes in your hair. These can include thickening of your hair, rapid growth, and changes in texture. Some women also have hair loss during or after pregnancy, or hair that feels dry or thin. Your hair will most likely return to normal after your baby is born.  What to expect at prenatal visits  During a routine prenatal visit:   You will be weighed to make sure you and the baby are growing normally.   Your blood pressure will be taken.   Your abdomen will be measured to track your baby's growth.   The fetal heartbeat will be listened to between weeks 10 and 14 of your pregnancy.   Test results from any previous visits will be discussed.  Your health care provider may ask you:     How you are feeling.   If you are feeling the baby move.   If you have had any abnormal symptoms, such as leaking fluid, bleeding, severe headaches, or abdominal cramping.   If you are using any tobacco products, including cigarettes, chewing tobacco, and electronic cigarettes.   If you have any questions.  Other tests that may be performed during your first trimester include:   Blood tests to find your blood type and to check for the presence of any previous infections. The tests will also be used to check for low iron levels (anemia) and protein on red blood cells (Rh antibodies). Depending on your risk factors, or if you previously had diabetes during pregnancy, you may have tests to check for high blood sugar  that affects pregnant women (gestational diabetes).   Urine tests to check for infections, diabetes, or protein in the urine.   An ultrasound to confirm the proper growth and development of the baby.   Fetal screens for spinal cord problems (spina bifida) and Down syndrome.   HIV (human immunodeficiency virus) testing. Routine prenatal testing includes screening for HIV, unless you choose not to have this test.   You may need other tests to make sure you and the baby are doing well.  Follow these instructions at home:  Medicines   Follow your health care provider's instructions regarding medicine use. Specific medicines may be either safe or unsafe to take during pregnancy.   Take a prenatal vitamin that contains at least 600 micrograms (mcg) of folic acid.   If you develop constipation, try taking a stool softener if your health care provider approves.  Eating and drinking     Eat a balanced diet that includes fresh fruits and vegetables, whole grains, good sources of protein such as meat, eggs, or tofu, and low-fat dairy. Your health care provider will help you determine the amount of weight gain that is right for you.   Avoid raw meat and uncooked cheese. These carry germs that can cause birth defects in the baby.   Eating four or five small meals rather than three large meals a day may help relieve nausea and vomiting. If you start to feel nauseous, eating a few soda crackers can be helpful. Drinking liquids between meals, instead of during meals, also seems to help ease nausea and vomiting.   Limit foods that are high in fat and processed sugars, such as fried and sweet foods.   To prevent constipation:  ? Eat foods that are high in fiber, such as fresh fruits and vegetables, whole grains, and beans.  ? Drink enough fluid to keep your urine clear or pale yellow.  Activity   Exercise only as directed by your health care provider. Most women can continue their usual exercise routine during  pregnancy. Try to exercise for 30 minutes at least 5 days a week. Exercising will help you:  ? Control your weight.  ? Stay in shape.  ? Be prepared for labor and delivery.   Experiencing pain or cramping in the lower abdomen or lower back is a good sign that you should stop exercising. Check with your health care provider before continuing with normal exercises.   Try to avoid standing for long periods of time. Move your legs often if you must stand in one place for a long time.   Avoid heavy lifting.   Wear low-heeled shoes and practice good posture.   You may continue to have sex unless your health care   provider tells you not to.  Relieving pain and discomfort   Wear a good support bra to relieve breast tenderness.   Take warm sitz baths to soothe any pain or discomfort caused by hemorrhoids. Use hemorrhoid cream if your health care provider approves.   Rest with your legs elevated if you have leg cramps or low back pain.   If you develop varicose veins in your legs, wear support hose. Elevate your feet for 15 minutes, 3-4 times a day. Limit salt in your diet.  Prenatal care   Schedule your prenatal visits by the twelfth week of pregnancy. They are usually scheduled monthly at first, then more often in the last 2 months before delivery.   Write down your questions. Take them to your prenatal visits.   Keep all your prenatal visits as told by your health care provider. This is important.  Safety   Wear your seat belt at all times when driving.   Make a list of emergency phone numbers, including numbers for family, friends, the hospital, and police and fire departments.  General instructions   Ask your health care provider for a referral to a local prenatal education class. Begin classes no later than the beginning of month 6 of your pregnancy.   Ask for help if you have counseling or nutritional needs during pregnancy. Your health care provider can offer advice or refer you to specialists for help  with various needs.   Do not use hot tubs, steam rooms, or saunas.   Do not douche or use tampons or scented sanitary pads.   Do not cross your legs for long periods of time.   Avoid cat litter boxes and soil used by cats. These carry germs that can cause birth defects in the baby and possibly loss of the fetus by miscarriage or stillbirth.   Avoid all smoking, herbs, alcohol, and medicines not prescribed by your health care provider. Chemicals in these products affect the formation and growth of the baby.   Do not use any products that contain nicotine or tobacco, such as cigarettes and e-cigarettes. If you need help quitting, ask your health care provider. You may receive counseling support and other resources to help you quit.   Schedule a dentist appointment. At home, brush your teeth with a soft toothbrush and be gentle when you floss.  Contact a health care provider if:   You have dizziness.   You have mild pelvic cramps, pelvic pressure, or nagging pain in the abdominal area.   You have persistent nausea, vomiting, or diarrhea.   You have a bad smelling vaginal discharge.   You have pain when you urinate.   You notice increased swelling in your face, hands, legs, or ankles.   You are exposed to fifth disease or chickenpox.   You are exposed to German measles (rubella) and have never had it.  Get help right away if:   You have a fever.   You are leaking fluid from your vagina.   You have spotting or bleeding from your vagina.   You have severe abdominal cramping or pain.   You have rapid weight gain or loss.   You vomit blood or material that looks like coffee grounds.   You develop a severe headache.   You have shortness of breath.   You have any kind of trauma, such as from a fall or a car accident.  Summary   The first trimester of pregnancy is from week 1 until   the end of week 13 (months 1 through 3).   Your body goes through many changes during pregnancy. The changes vary from  woman to woman.   You will have routine prenatal visits. During those visits, your health care provider will examine you, discuss any test results you may have, and talk with you about how you are feeling.  This information is not intended to replace advice given to you by your health care provider. Make sure you discuss any questions you have with your health care provider.  Document Released: 06/26/2001 Document Revised: 06/13/2016 Document Reviewed: 06/13/2016  [employer] Interactive Patient Education  2019 [employer]

## 2018-12-03 NOTE — MAU Provider Note (Signed)
Chief Complaint: Abdominal Pain and Back Pain   First Provider Initiated Contact with Patient 12/03/18 2048        SUBJECTIVE HPI: Colleen Nichols is a 23 y.o. G3P2002 at 5244w0d by LMP who presents to maternity admissions reporting Sudden onset of lower abdominal and low back pain at 1700hrs today.  Said she drank some soda and thinks that might have caused her pain.  Was seen in ED for insect bites at 2pm and had no pain then.  Had requested an pregnancy verification letter and they said they could not give her one.  .  Also states she has had pain in her vagina for years and "no one has ever examined me for it".  Cannot describe it, except it is shooting pain and hurts with intercourse. Was even present before last birth.  She denies vaginal bleeding, vaginal itching/burning, urinary symptoms, h/a, dizziness, n/v, or fever/chills.     RN Note: Pt states that she was at the ER earlier and did not have pain.  Pt states that she started having abdominal and back pain around 1700. Denies vaginal bleeding.   Past Medical History:  Diagnosis Date  . Asthma    Past Surgical History:  Procedure Laterality Date  . WISDOM TOOTH EXTRACTION     Social History   Socioeconomic History  . Marital status: Single    Spouse name: Not on file  . Number of children: Not on file  . Years of education: Not on file  . Highest education level: Not on file  Occupational History  . Not on file  Social Needs  . Financial resource strain: Not on file  . Food insecurity:    Worry: Not on file    Inability: Not on file  . Transportation needs:    Medical: Not on file    Non-medical: Not on file  Tobacco Use  . Smoking status: Former Games developermoker  . Smokeless tobacco: Never Used  Substance and Sexual Activity  . Alcohol use: Not Currently    Comment: occasional  . Drug use: No  . Sexual activity: Yes  Lifestyle  . Physical activity:    Days per week: Not on file    Minutes per session: Not on  file  . Stress: Not on file  Relationships  . Social connections:    Talks on phone: Not on file    Gets together: Not on file    Attends religious service: Not on file    Active member of club or organization: Not on file    Attends meetings of clubs or organizations: Not on file    Relationship status: Not on file  . Intimate partner violence:    Fear of current or ex partner: Not on file    Emotionally abused: Not on file    Physically abused: Not on file    Forced sexual activity: Not on file  Other Topics Concern  . Not on file  Social History Narrative  . Not on file   No current facility-administered medications on file prior to encounter.    Current Outpatient Medications on File Prior to Encounter  Medication Sig Dispense Refill  . albuterol (PROVENTIL HFA;VENTOLIN HFA) 108 (90 Base) MCG/ACT inhaler Inhale 1-2 puffs into the lungs every 6 (six) hours as needed for wheezing or shortness of breath.    . cephALEXin (KEFLEX) 500 MG capsule Take 1 capsule (500 mg total) by mouth 2 (two) times daily for 5 days. 10 capsule 0  .  ibuprofen (ADVIL) 200 MG tablet Take 400 mg by mouth every 6 (six) hours as needed for headache or moderate pain.    Marland Kitchen ibuprofen (ADVIL,MOTRIN) 600 MG tablet Take 1 tablet (600 mg total) by mouth every 6 (six) hours. (Patient not taking: Reported on 09/24/2017) 30 tablet 0  . medroxyPROGESTERone (DEPO-PROVERA) 150 MG/ML injection Inject 1 mL (150 mg total) into the muscle every 3 (three) months. (Patient not taking: Reported on 12/03/2018) 1 mL 2  . medroxyPROGESTERone (PROVERA) 10 MG tablet Take 20 mg Three times a day for 7 days, then take 20 mg daily for 3 weeks (Patient not taking: Reported on 12/03/2018) 84 tablet 0  . metroNIDAZOLE (FLAGYL PO) Take 1 tablet by mouth daily.    . Norethindrone-Ethinyl Estradiol-Fe Biphas (LO LOESTRIN FE) 1 MG-10 MCG / 10 MCG tablet Take 1 tablet by mouth daily. Take 1 tablet by mouth daily. (Patient not taking: Reported on  12/03/2018) 3 Package 4  . Prenat-FeAsp-Meth-FA-DHA w/o A (PRENATE PIXIE) 10-0.6-0.4-200 MG CAPS Take 1 tablet by mouth daily. (Patient not taking: Reported on 09/24/2017) 30 capsule 12  . Prenatal Vit-Fe Fumarate-FA (PRENATAL COMPLETE) 14-0.4 MG TABS Take 1 pill a day. 60 each 3   No Known Allergies  I have reviewed patient's Past Medical Hx, Surgical Hx, Family Hx, Social Hx, medications and allergies.   ROS:  Review of Systems  Constitutional: Negative for chills and fever.  Respiratory: Negative for shortness of breath.   Gastrointestinal: Positive for abdominal pain. Negative for constipation, diarrhea, nausea and vomiting.  Genitourinary: Positive for pelvic pain. Negative for vaginal bleeding and vaginal discharge.  Musculoskeletal: Positive for back pain.   Review of Systems  Other systems negative   Physical Exam  Physical Exam Patient Vitals for the past 24 hrs:  BP Temp Pulse Resp SpO2  12/03/18 2246 131/74 - 75 - -  12/03/18 2026 132/74 99.1 F (37.3 C) 74 12 100 %   Constitutional: Well-developed, well-nourished female in no acute distress. Does not appear uncomfortable.    Cardiovascular: normal rate Respiratory: normal effort GI: Abd soft, non-tender. Pos BS x 4 MS: Extremities nontender, no edema, normal ROM Neurologic: Alert and oriented x 4.  GU: Neg CVAT.  PELVIC EXAM: Cervix pink, visually closed, without lesion, scant white creamy discharge, vaginal walls and external genitalia normal Bimanual exam: Cervix 0/long/high, firm, anterior, neg CMT, uterus mildly tender, nonenlarged, adnexa without tenderness, enlargement, or mass   LAB RESULTS Results for orders placed or performed during the hospital encounter of 12/03/18 (from the past 24 hour(s))  Wet prep, genital     Status: Abnormal   Collection Time: 12/03/18  8:39 PM  Result Value Ref Range   Yeast Wet Prep HPF POC NONE SEEN NONE SEEN   Trich, Wet Prep NONE SEEN NONE SEEN   Clue Cells Wet Prep HPF  POC NONE SEEN NONE SEEN   WBC, Wet Prep HPF POC FEW (A) NONE SEEN   Sperm NONE SEEN   CBC     Status: None   Collection Time: 12/03/18  8:46 PM  Result Value Ref Range   WBC 7.9 4.0 - 10.5 K/uL   RBC 4.63 3.87 - 5.11 MIL/uL   Hemoglobin 13.3 12.0 - 15.0 g/dL   HCT 25.8 52.7 - 78.2 %   MCV 84.0 80.0 - 100.0 fL   MCH 28.7 26.0 - 34.0 pg   MCHC 34.2 30.0 - 36.0 g/dL   RDW 42.3 53.6 - 14.4 %   Platelets 200  150 - 400 K/uL   nRBC 0.0 0.0 - 0.2 %  hCG, quantitative, pregnancy     Status: Abnormal   Collection Time: 12/03/18  8:46 PM  Result Value Ref Range   hCG, Beta Chain, Quant, S 6,423 (H) <5 mIU/mL   Blood type O+  IMAGING US Ob Comp Less 14 Wks  Result Date: 12/03/2018 CLINICAL DATA:  23 year old female with pelvic pain in the 1st trimester of pregnancy. Estimated gestational age by LMP 5 weeks 0 days. EXAM: OBSTETRIC <14 WK [place] AND TRANSVAGINAL OB US TECHNIQUE: Both transabdominal and transvaginal ultrasound examinations were performed for complete evaluation of the gestation as well as the maternal uterus, adnexal regions, and pelvic cul-de-sac. Transvaginal technique was performed to assess early pregnancy. COMPARISON:  None relevant. FINDINGS: Intrauterine gestational sac: A single small gestational sac is suspected in the lower uterus (image 2, image 15). Yolk sac:  Visible (image 24). Embryo:  Not visible Cardiac Activity: Not applicable MSD: 7.37 mm   5 w   3 d Subchorionic hemorrhage: None visualized. Trace superimposed endometrial fluid. Maternal uterus/adnexae: Normal right ovary 1.3 x 3.5 x 1.3 centimeters. The left ovary also appears normal but is larger measuring 2.7 x 3.2 x 3.5 centimeters. Probable corpus luteum on the left. No pelvic free fluid. IMPRESSION: Probable early intrauterine gestational sac with yolk sac, but no fetal pole, or cardiac activity yet visualized. Recommend follow-up quantitative B-HCG levels and follow-up US in 14 days to confirm and assess viability.  This recommendation follows SRU consensus guidelines: Diagnostic Criteria for Nonviable Pregnancy Early in the First Trimester. Malva Limes Med 2013; 161:0960-45. Electronically Signed   By: Odessa Fleming M.D.   On: 12/03/2018 21:50   US Ob Transvaginal  Result Date: 12/03/2018 CLINICAL DATA:  23 year old female with pelvic pain in the 1st trimester of pregnancy. Estimated gestational age by LMP 5 weeks 0 days. EXAM: OBSTETRIC <14 WK [place] AND TRANSVAGINAL OB US TECHNIQUE: Both transabdominal and transvaginal ultrasound examinations were performed for complete evaluation of the gestation as well as the maternal uterus, adnexal regions, and pelvic cul-de-sac. Transvaginal technique was performed to assess early pregnancy. COMPARISON:  None relevant. FINDINGS: Intrauterine gestational sac: A single small gestational sac is suspected in the lower uterus (image 2, image 15). Yolk sac:  Visible (image 24). Embryo:  Not visible Cardiac Activity: Not applicable MSD: 7.37 mm   5 w   3 d Subchorionic hemorrhage: None visualized. Trace superimposed endometrial fluid. Maternal uterus/adnexae: Normal right ovary 1.3 x 3.5 x 1.3 centimeters. The left ovary also appears normal but is larger measuring 2.7 x 3.2 x 3.5 centimeters. Probable corpus luteum on the left. No pelvic free fluid. IMPRESSION: Probable early intrauterine gestational sac with yolk sac, but no fetal pole, or cardiac activity yet visualized. Recommend follow-up quantitative B-HCG levels and follow-up US in 14 days to confirm and assess viability. This recommendation follows SRU consensus guidelines: Diagnostic Criteria for Nonviable Pregnancy Early in the First Trimester. Malva Limes Med 2013; 409:8119-14. Electronically Signed   By: Odessa Fleming M.D.   On: 12/03/2018 21:50    MAU Management/MDM: Ordered usual first trimester r/o ectopic labs.   Pelvic exam and cultures done Will check baseline Ultrasound to rule out ectopic.   Reviewed results.  Since Gestational sac and  yolk sac were seen, that effectively rules out ectopic/pseudosac.  This bleeding/pain can represent a normal pregnancy with bleeding, spontaneous abortion or even an ectopic which can be life-threatening.  The process  as listed above helps to determine which of these is present.  Proof of pregnancy letter provided  ASSESSMENT 1. Pregnancy of unknown anatomic location   2. Pelvic pain affecting pregnancy   3.     Single intrauterine gestational sac with yolk sac at [redacted]w[redacted]d 4.     Pregnancy at [redacted]w[redacted]d by LMP  PLAN Discharge home Encouraged to seek [facility] Encouraged to return here or to other Urgent Care/ED if she develops worsening of symptoms, increase in pain, fever, or other concerning symptoms.   Follow-up Information    [facility] Follow up.   Specialty:  Obstetrics and Gynecology Contact information: [address] [place] [address], [address] [id number] mc [place] [address] [phone]       [facility]. Schedule an appointment as soon as possible for a visit.   Specialty:  Obstetrics and Gynecology Contact information: [address], [address] [place] [address] [phone]         Pt stable at time of discharge. Encouraged to return here or to other Urgent Care/ED if she develops worsening of symptoms, increase in pain, fever, or other concerning symptoms.    Wynelle Bourgeois CNM, MSN Certified Nurse-Midwife 12/03/2018  10:56 PM

## 2018-12-04 LAB — GC/CHLAMYDIA PROBE AMP (~~LOC~~) NOT AT ARMC
Chlamydia: NEGATIVE
Neisseria Gonorrhea: NEGATIVE

## 2018-12-04 LAB — HIV ANTIBODY (ROUTINE TESTING W REFLEX): HIV Screen 4th Generation wRfx: NONREACTIVE

## 2019-01-02 ENCOUNTER — Other Ambulatory Visit: Payer: Self-pay

## 2019-01-02 ENCOUNTER — Telehealth: Payer: Self-pay | Admitting: Family Medicine

## 2019-01-02 ENCOUNTER — Encounter (HOSPITAL_COMMUNITY): Payer: Self-pay | Admitting: *Deleted

## 2019-01-02 ENCOUNTER — Inpatient Hospital Stay (HOSPITAL_COMMUNITY)
Admission: AD | Admit: 2019-01-02 | Discharge: 2019-01-02 | Disposition: A | Discharge status: Home / Self Care | Payer: Medicaid Other | Attending: Obstetrics and Gynecology | Admitting: Obstetrics and Gynecology

## 2019-01-02 DIAGNOSIS — R110 Nausea: Secondary | ICD-10-CM | POA: Diagnosis not present

## 2019-01-02 DIAGNOSIS — R42 Dizziness and giddiness: Secondary | ICD-10-CM | POA: Diagnosis present

## 2019-01-02 DIAGNOSIS — Z87891 Personal history of nicotine dependence: Secondary | ICD-10-CM | POA: Insufficient documentation

## 2019-01-02 DIAGNOSIS — M7918 Myalgia, other site: Secondary | ICD-10-CM | POA: Insufficient documentation

## 2019-01-02 DIAGNOSIS — O26891 Other specified pregnancy related conditions, first trimester: Secondary | ICD-10-CM | POA: Diagnosis not present

## 2019-01-02 DIAGNOSIS — O9989 Other specified diseases and conditions complicating pregnancy, childbirth and the puerperium: Secondary | ICD-10-CM | POA: Insufficient documentation

## 2019-01-02 DIAGNOSIS — Z362 Encounter for other antenatal screening follow-up: Secondary | ICD-10-CM

## 2019-01-02 DIAGNOSIS — Z3A09 9 weeks gestation of pregnancy: Secondary | ICD-10-CM | POA: Diagnosis not present

## 2019-01-02 DIAGNOSIS — O3680X0 Pregnancy with inconclusive fetal viability, not applicable or unspecified: Secondary | ICD-10-CM | POA: Diagnosis not present

## 2019-01-02 DIAGNOSIS — O210 Mild hyperemesis gravidarum: Secondary | ICD-10-CM

## 2019-01-02 HISTORY — DX: Unspecified infectious disease: B99.9

## 2019-01-02 LAB — URINALYSIS, ROUTINE W REFLEX MICROSCOPIC
Bilirubin Urine: NEGATIVE
Glucose, UA: NEGATIVE mg/dL
Hgb urine dipstick: NEGATIVE
Ketones, ur: NEGATIVE mg/dL
Leukocytes,Ua: NEGATIVE
Nitrite: NEGATIVE
Protein, ur: NEGATIVE mg/dL
Specific Gravity, Urine: 1.02 (ref 1.005–1.030)
pH: 6 (ref 5.0–8.0)

## 2019-01-02 MED ORDER — DOXYLAMINE-PYRIDOXINE 10-10 MG PO TBEC: 2.0000 | DELAYED_RELEASE_TABLET | Freq: Every day | ORAL | 1 refills | Status: AC

## 2019-01-02 NOTE — Discharge Instructions (Signed)
Morning Sickness  Morning sickness is when you feel sick to your stomach (nauseous) during pregnancy. You may feel sick to your stomach and throw up (vomit). You may feel sick in the morning, but you can feel this way at any time of day. Some women feel very sick to their stomach and cannot stop throwing up (hyperemesis gravidarum). Follow these instructions at home: Medicines  Take over-the-counter and prescription medicines only as told by your doctor. Do not take any medicines until you talk with your doctor about them first.  Taking multivitamins before getting pregnant can stop or lessen the harshness of morning sickness. Eating and drinking  Eat dry toast or crackers before getting out of bed.  Eat 5 or 6 small meals a day.  Eat dry and bland foods like rice and baked potatoes.  Do not eat greasy, fatty, or spicy foods.  Have someone cook for you if the smell of food causes you to feel sick or throw up.  If you feel sick to your stomach after taking prenatal vitamins, take them at night or with a snack.  Eat protein when you need a snack. Nuts, yogurt, and cheese are good choices.  Drink fluids throughout the day.  Try ginger ale made with real ginger, ginger tea made from fresh grated ginger, or ginger candies. General instructions  Do not use any products that have nicotine or tobacco in them, such as cigarettes and e-cigarettes. If you need help quitting, ask your doctor.  Use an air purifier to keep the air in your house free of smells.  Get lots of fresh air.  Try to avoid smells that make you feel sick.  Try: ? Wearing a bracelet that is used for seasickness (acupressure wristband). ? Going to a doctor who puts thin needles into certain body points (acupuncture) to improve how you feel. Contact a doctor if:  You need medicine to feel better.  You feel dizzy or light-headed.  You are losing weight. Get help right away if:  You feel very sick to your  stomach and cannot stop throwing up.  You pass out (faint).  You have very bad pain in your belly. Summary  Morning sickness is when you feel sick to your stomach (nauseous) during pregnancy.  You may feel sick in the morning, but you can feel this way at any time of day.  Making some changes to what you eat may help your symptoms go away. This information is not intended to replace advice given to you by your health care provider. Make sure you discuss any questions you have with your health care provider. Document Released: 08/09/2004 Document Revised: 08/02/2016 Document Reviewed: 08/02/2016 Elsevier Interactive Patient Education  2019 Elsevier Inc.   Abdominal Pain During Pregnancy  Belly (abdominal) pain is common during pregnancy. There are many possible causes. Most of the time, it is not a serious problem. Other times, it can be a sign that something is wrong with the pregnancy. Always tell your doctor if you have belly pain. Follow these instructions at home:  Do not have sex or put anything in your vagina until your pain goes away completely.  Get plenty of rest until your pain gets better.  Drink enough fluid to keep your pee (urine) pale yellow.  Take over-the-counter and prescription medicines only as told by your doctor.  Keep all follow-up visits as told by your doctor. This is important. Contact a doctor if:  Your pain continues or gets worse after  resting.  You have lower belly pain that: ? Comes and goes at regular times. ? Spreads to your back. ? Feels like menstrual cramps.  You have pain or burning when you pee (urinate). Get help right away if:  You have a fever or chills.  You have vaginal bleeding.  You are leaking fluid from your vagina.  You are passing tissue from your vagina.  You throw up (vomit) for more than 24 hours.  You have watery poop (diarrhea) for more than 24 hours.  Your baby is moving less than usual.  You feel very  weak or faint.  You have shortness of breath.  You have very bad pain in your upper belly. Summary  Belly (abdominal) pain is common during pregnancy. There are many possible causes.  If you have belly pain during pregnancy, tell your doctor right away.  Keep all follow-up visits as told by your doctor. This is important. This information is not intended to replace advice given to you by your health care provider. Make sure you discuss any questions you have with your health care provider. Document Released: 06/20/2009 Document Revised: 10/04/2016 Document Reviewed: 10/04/2016 Elsevier Interactive Patient Education  2019 Elsevier Inc.   Abdominal Pain During Pregnancy  Belly (abdominal) pain is common during pregnancy. There are many possible causes. Most of the time, it is not a serious problem. Other times, it can be a sign that something is wrong with the pregnancy. Always tell your doctor if you have belly pain. Follow these instructions at home:  Do not have sex or put anything in your vagina until your pain goes away completely.  Get plenty of rest until your pain gets better.  Drink enough fluid to keep your pee (urine) pale yellow.  Take over-the-counter and prescription medicines only as told by your doctor.  Keep all follow-up visits as told by your doctor. This is important. Contact a doctor if:  Your pain continues or gets worse after resting.  You have lower belly pain that: ? Comes and goes at regular times. ? Spreads to your back. ? Feels like menstrual cramps.  You have pain or burning when you pee (urinate). Get help right away if:  You have a fever or chills.  You have vaginal bleeding.  You are leaking fluid from your vagina.  You are passing tissue from your vagina.  You throw up (vomit) for more than 24 hours.  You have watery poop (diarrhea) for more than 24 hours.  Your baby is moving less than usual.  You feel very weak or  faint.  You have shortness of breath.  You have very bad pain in your upper belly. Summary  Belly (abdominal) pain is common during pregnancy. There are many possible causes.  If you have belly pain during pregnancy, tell your doctor right away.  Keep all follow-up visits as told by your doctor. This is important. This information is not intended to replace advice given to you by your health care provider. Make sure you discuss any questions you have with your health care provider. Document Released: 06/20/2009 Document Revised: 10/04/2016 Document Reviewed: 10/04/2016 Elsevier Interactive Patient Education  2019 [employer].

## 2019-01-02 NOTE — MAU Note (Addendum)
Been having sharp pelvic pain, like a throbbing pain. Has been here before for it. Having some pain on rt side of abd.  everytime she gets up, she is dizzy and light headed and her vision blurs. Has been so nauseous, lost a lot of weight, can't eat.

## 2019-01-02 NOTE — Telephone Encounter (Signed)
Spoke to patient about her appointment on 6/22 @ 2:15. Patient stated that she has found another OBGYN and would like to cancel the appointment. The appointment was cancelled

## 2019-01-02 NOTE — MAU Provider Note (Signed)
History     CSN: [id number]  Arrival date and time: 01/02/19 1608   First Provider Initiated Contact with Patient 01/02/19 1841      Chief Complaint  Patient presents with  . Dizziness  . Pelvic Pain  . Abdominal Pain   G3P2002 @[redacted]w[redacted]d  presenting with morning sickness and pelvic pain. Reports nausea has been ongoing for weeks. No vomiting. Appetite is poor. Reports weight loss. Has not taken anything for nausea. Pelvic pain has been ongoing "for years", even when not pregnant. Pain is intermittent, not every day, last about 2 min, spontaneously resolves. IC is painful sometimes. No urinary sx. No vaginal discharge. No VB. Also reports intermittent RLQ pain. Describes as pressure, thinks it's muscle stretching.    OB History    Gravida  3   Para  2   Term  2   Preterm      AB      Living  2     SAB      TAB      Ectopic      Multiple  0   Live Births  2           Past Medical History:  Diagnosis Date  . Asthma   . Infection    UTI    Past Surgical History:  Procedure Laterality Date  . NO PAST SURGERIES    . WISDOM TOOTH EXTRACTION      Family History  Problem Relation Age of Onset  . Healthy Mother   . Healthy Father   . Diabetes Maternal Grandmother   . Hypertension Maternal Grandmother   . Hypertension Paternal Grandmother     Social History   Tobacco Use  . Smoking status: Former Games developermoker  . Smokeless tobacco: Never Used  Substance Use Topics  . Alcohol use: Not Currently    Comment: occasional  . Drug use: No    Allergies: No Known Allergies  No medications prior to admission.    Review of Systems  Constitutional: Negative for fever.  Gastrointestinal: Positive for abdominal pain and nausea. Negative for constipation, diarrhea and vomiting.  Genitourinary: Negative for dysuria, frequency, hematuria, urgency, vaginal bleeding and vaginal discharge.  Musculoskeletal: Negative for back pain.   Physical Exam   Blood pressure  135/65, pulse 90, temperature 98.1 F (36.7 C), temperature source Oral, resp. rate 16, weight 71.4 kg, last menstrual period 10/29/2018, SpO2 100 %, not currently breastfeeding.  Physical Exam  Nursing note and vitals reviewed. Constitutional: She is oriented to person, place, and time. She appears well-developed and well-nourished. No distress.  HENT:  Head: Normocephalic and atraumatic.  Neck: Normal range of motion.  Cardiovascular: Normal rate.  Respiratory: Effort normal. No respiratory distress.  GI: Soft. She exhibits no distension and no mass. There is no abdominal tenderness. There is no rebound and no guarding.  Musculoskeletal: Normal range of motion.  Neurological: She is alert and oriented to person, place, and time.  Skin: Skin is warm and dry.  Psychiatric: She has a normal mood and affect.   Limited bedside US: viable, active fetus, CRL measures 3245w2d, +cardiac activity>171 bpm, subj. nml AFV  Results for orders placed or performed during the hospital encounter of 01/02/19 (from the past 24 hour(s))  Urinalysis, Routine w reflex microscopic     Status: None   Collection Time: 01/02/19  5:14 PM  Result Value Ref Range   Color, Urine YELLOW YELLOW   APPearance CLEAR CLEAR   Specific Gravity, Urine 1.020  1.005 - 1.030   pH 6.0 5.0 - 8.0   Glucose, UA NEGATIVE NEGATIVE mg/dL   Hgb urine dipstick NEGATIVE NEGATIVE   Bilirubin Urine NEGATIVE NEGATIVE   Ketones, ur NEGATIVE NEGATIVE mg/dL   Protein, ur NEGATIVE NEGATIVE mg/dL   Nitrite NEGATIVE NEGATIVE   Leukocytes,Ua NEGATIVE NEGATIVE   MAU Course  Procedures  MDM Labs ordered and reviewed. Recent GC negative. No evidence of UTI. Pain likely MSK, discussed comfort measures. Viable IUP confirmed by [place]. Stable for discharge home.   Assessment and Plan   1. [redacted] weeks gestation of pregnancy   2. Pregnancy of unknown anatomic location   3. Morning sickness   4. Musculoskeletal pain    Discharge home Follow up at  [facility] to start care SAB precautions Rx Diclegis  Allergies as of 01/02/2019   No Known Allergies     Medication List    STOP taking these medications   FLAGYL PO   Prenate Pixie 10-0.6-0.4-200 MG Caps     TAKE these medications   albuterol 108 (90 Base) MCG/ACT inhaler Commonly known as: VENTOLIN HFA Inhale 1-2 puffs into the lungs every 6 (six) hours as needed for wheezing or shortness of breath.   Doxylamine-Pyridoxine 10-10 MG Tbec Take 2 tablets by mouth at bedtime. May also take 1 tab in morning and 1 tab in afternoon   Prenatal Complete 14-0.4 MG Tabs Take 1 pill a day.      Julianne Handler, CNM 01/02/2019, 7:48 PM

## 2019-01-05 ENCOUNTER — Telehealth: Payer: Self-pay

## 2019-01-27 ENCOUNTER — Encounter (HOSPITAL_COMMUNITY): Payer: Self-pay | Admitting: Student

## 2019-01-27 ENCOUNTER — Inpatient Hospital Stay (HOSPITAL_COMMUNITY)
Admission: AD | Admit: 2019-01-27 | Discharge: 2019-01-27 | Disposition: A | Discharge status: Home / Self Care | Payer: Medicaid Other | Attending: Obstetrics and Gynecology | Admitting: Obstetrics and Gynecology

## 2019-01-27 ENCOUNTER — Other Ambulatory Visit: Payer: Self-pay

## 2019-01-27 DIAGNOSIS — O26891 Other specified pregnancy related conditions, first trimester: Secondary | ICD-10-CM

## 2019-01-27 DIAGNOSIS — Z87891 Personal history of nicotine dependence: Secondary | ICD-10-CM | POA: Diagnosis not present

## 2019-01-27 DIAGNOSIS — Z3A12 12 weeks gestation of pregnancy: Secondary | ICD-10-CM | POA: Diagnosis not present

## 2019-01-27 DIAGNOSIS — R51 Headache: Secondary | ICD-10-CM

## 2019-01-27 DIAGNOSIS — R519 Headache, unspecified: Secondary | ICD-10-CM

## 2019-01-27 MED ORDER — BUTALBITAL-APAP-CAFFEINE 50-325-40 MG PO TABS: 1.0000 | ORAL_TABLET | Freq: Four times a day (QID) | ORAL | 0 refills | Status: AC | PRN

## 2019-01-27 MED ORDER — PROMETHAZINE HCL 25 MG PO TABS: 12.5000 mg | ORAL_TABLET | Freq: Four times a day (QID) | ORAL | 0 refills | Status: AC | PRN

## 2019-01-27 NOTE — MAU Note (Signed)
.   Colleen Nichols is a 23 y.o. at [redacted]w[redacted]d here in MAU reporting: a headache for a couple of days that keeps getting worse. Took Tylenol this morning and hasn't helped  Onset of complaint: couple of days Pain score: 10 Vitals:   01/27/19 1107  Pulse: 93  Resp: 18  Temp: 97.9 F (36.6 C)  SpO2: 100%     FHT:154 Lab orders placed from triage: UA

## 2019-01-27 NOTE — MAU Provider Note (Signed)
History     CSN: [id number]  Arrival date and time: 01/27/19 1037   First Provider Initiated Contact with Patient 01/27/19 1123      Chief Complaint  Patient presents with  . Headache   Colleen JourneyShakaydra Nichols is a 23 y.o. G3P2002 at 9080w6d who presents today with a headache. She denies any cramping, vaginal discharge or vaginal bleeding. She is going to [facility] for prenatal care. Next visit: 02/10/2019  Headache  This is a new problem. The current episode started yesterday. The problem occurs constantly. The problem has been unchanged. The pain is located in the frontal region. The pain does not radiate. The quality of the pain is described as throbbing. The pain is at a severity of 10/10. Pertinent negatives include no fever, nausea or vomiting. Exacerbated by: position changes  She has tried acetaminophen for the symptoms. The treatment provided no relief.    OB History    Gravida  3   Para  2   Term  2   Preterm      AB      Living  2     SAB      TAB      Ectopic      Multiple  0   Live Births  2           Past Medical History:  Diagnosis Date  . Asthma   . Infection    UTI    Past Surgical History:  Procedure Laterality Date  . WISDOM TOOTH EXTRACTION      Family History  Problem Relation Age of Onset  . Healthy Mother   . Healthy Father   . Diabetes Maternal Grandmother   . Hypertension Maternal Grandmother   . Hypertension Paternal Grandmother     Social History   Tobacco Use  . Smoking status: Former Games developermoker  . Smokeless tobacco: Never Used  Substance Use Topics  . Alcohol use: Not Currently    Comment: occasional  . Drug use: No    Allergies: No Known Allergies  Medications Prior to Admission  Medication Sig Dispense Refill Last Dose  . albuterol (PROVENTIL HFA;VENTOLIN HFA) 108 (90 Base) MCG/ACT inhaler Inhale 1-2 puffs into the lungs every 6 (six) hours as needed for wheezing or shortness of breath.     . Doxylamine-Pyridoxine  10-10 MG TBEC Take 2 tablets by mouth at bedtime. May also take 1 tab in morning and 1 tab in afternoon 100 tablet 1   . Prenatal Vit-Fe Fumarate-FA (PRENATAL COMPLETE) 14-0.4 MG TABS Take 1 pill a day. 60 each 3     Review of Systems  Constitutional: Negative for chills and fever.  Gastrointestinal: Negative for nausea and vomiting.  Genitourinary: Negative for dysuria, pelvic pain, vaginal bleeding and vaginal discharge.  Neurological: Positive for headaches.   Physical Exam   Pulse 93, temperature 97.9 F (36.6 C), resp. rate 18, last menstrual period 10/29/2018, SpO2 100 %, not currently breastfeeding.  Physical Exam  Nursing note and vitals reviewed. Constitutional: She is oriented to person, place, and time. She appears well-developed and well-nourished. No distress.  HENT:  Head: Normocephalic.  Cardiovascular: Normal rate.  Respiratory: Effort normal.  GI: Soft. There is no abdominal tenderness. There is no rebound.  Neurological: She is alert and oriented to person, place, and time.  Skin: Skin is warm and dry.  Psychiatric: She has a normal mood and affect.   FHT 154 with doppler    MAU Course  Procedures  MDM Patient offered migraine cocktail here. She does not have a ride and she doesn't have anyone that can watch her kids for much longer. She is requesting something she can try at home for now. Offered fioricet and phenergan rx for her to try at home. She is agreeable to this plan. Patient advised if this does not work she can return here for IV medications. She states that her children's father will be home tomorrow, and she will be able to have a more reliable ride and childcare tomorrow.   Assessment and Plan   1. Pregnancy headache in first trimester    DC home Comfort measures reviewed  1st/2nd Trimester precautions  RX: fioricet PRN, Phenergan PRN  Return to MAU as needed FU with OB as planned  [facility] Follow up.   Specialty: Obstetrics and Gynecology Contact information: [address] [place] [address] [phone]         Lacee Grey DNP, CNM  01/27/19  11:58 AM

## 2019-01-27 NOTE — Discharge Instructions (Signed)

## 2020-08-01 IMAGING — US OBSTETRIC <14 WK ULTRASOUND
1 series · 15 of 28 positions shown · non-contrast
Comparison: None relevant.

CLINICAL DATA: 23-year-old female with pelvic pain in the 1st
trimester of pregnancy. Estimated gestational age by LMP 5 weeks 0
days.

EXAM:
OBSTETRIC <14 WK US AND TRANSVAGINAL OB US
TECHNIQUE: Both transabdominal and transvaginal ultrasound examinations were
performed for complete evaluation of the gestation as well as the
maternal uterus, adnexal regions, and pelvic cul-de-sac.
Transvaginal technique was performed to assess early pregnancy.

[Series 1: obstetric <14 wk ultrasound · 15 of 34 slices shown]
[im 1/34]
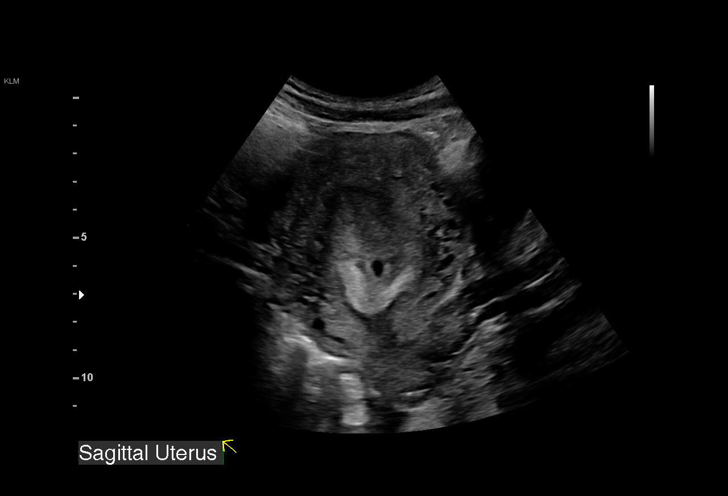
[im 3/34]
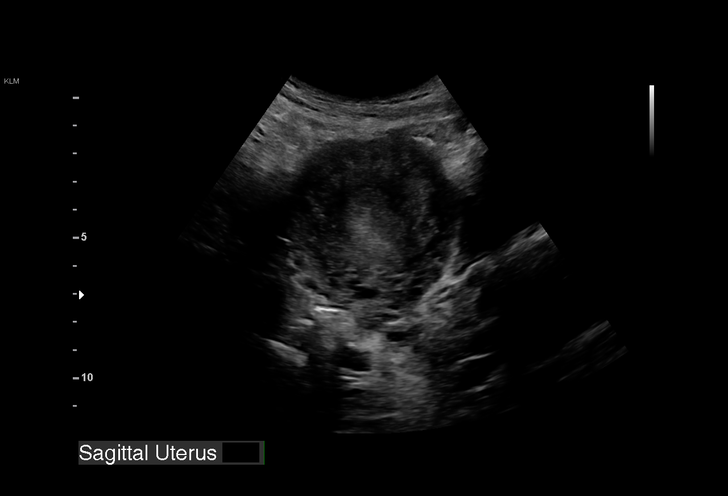
[im 5/34]
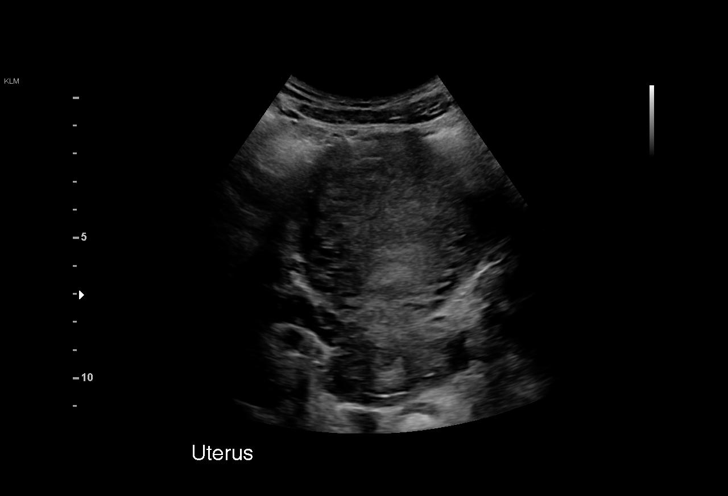
[im 8/34]
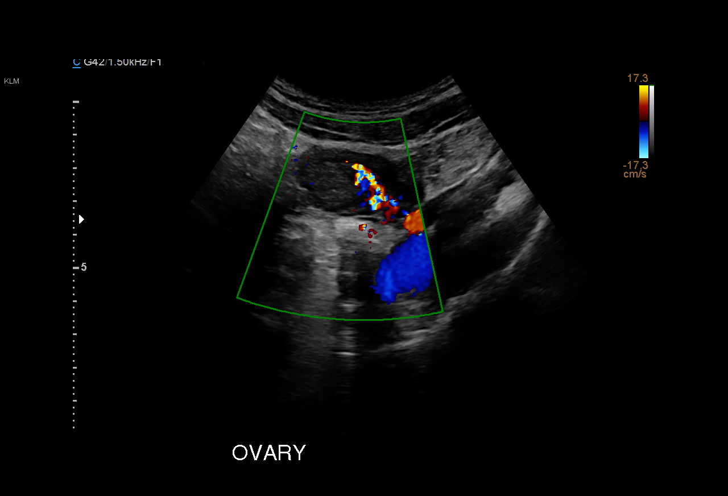
[im 10/34]
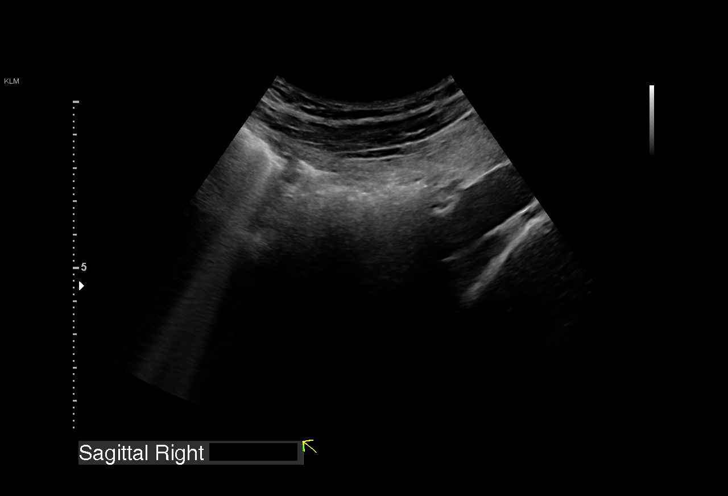
[im 13/34]
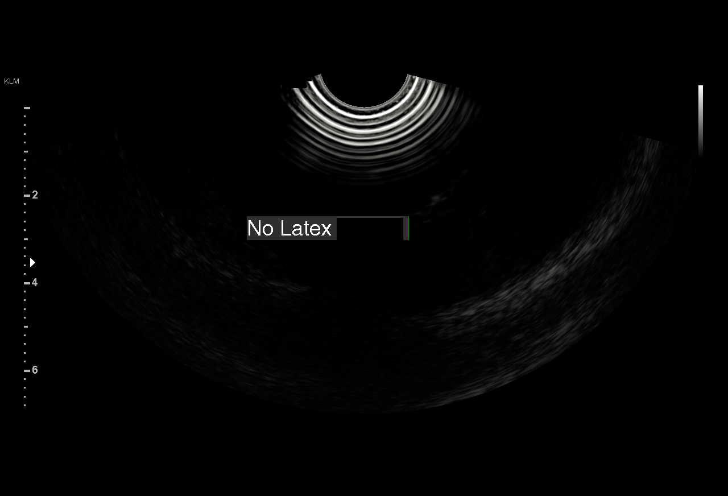
[im 15/34]
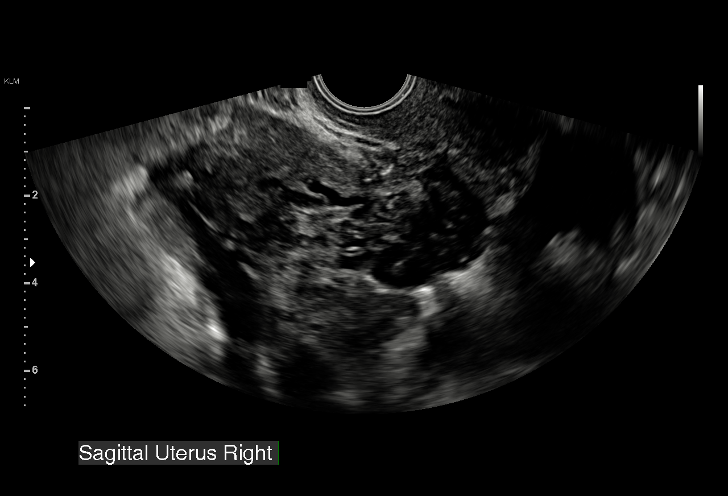
[im 18/34]
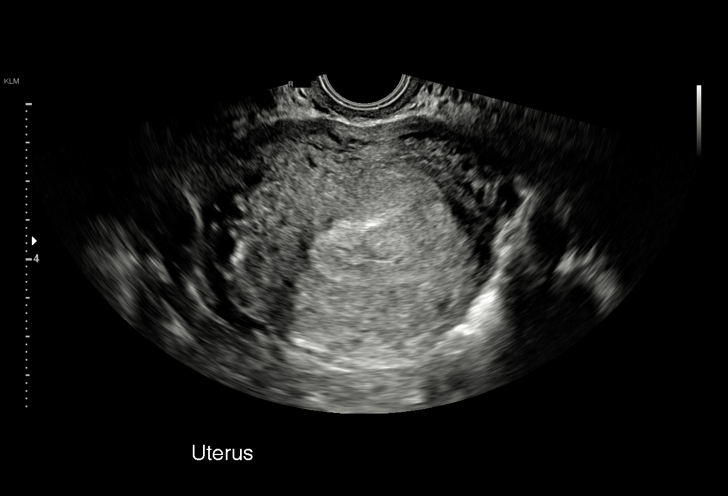
[im 19/34]
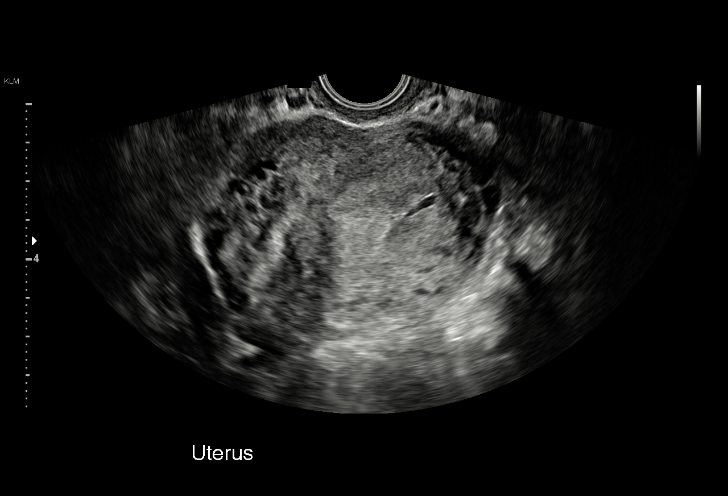
[im 21/34]
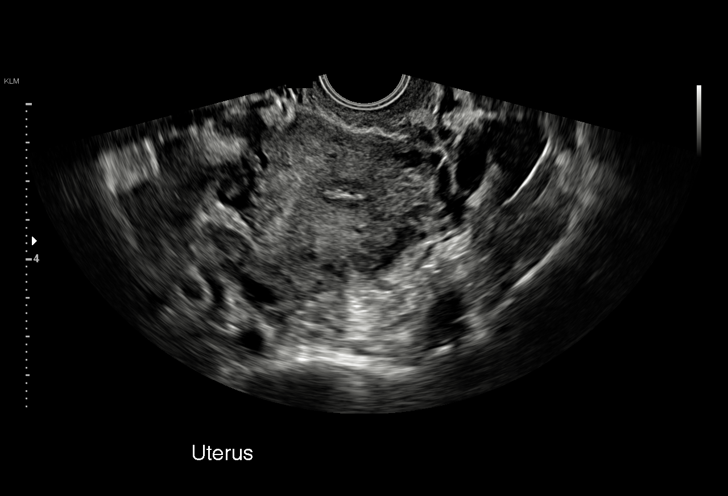
[im 24/34]
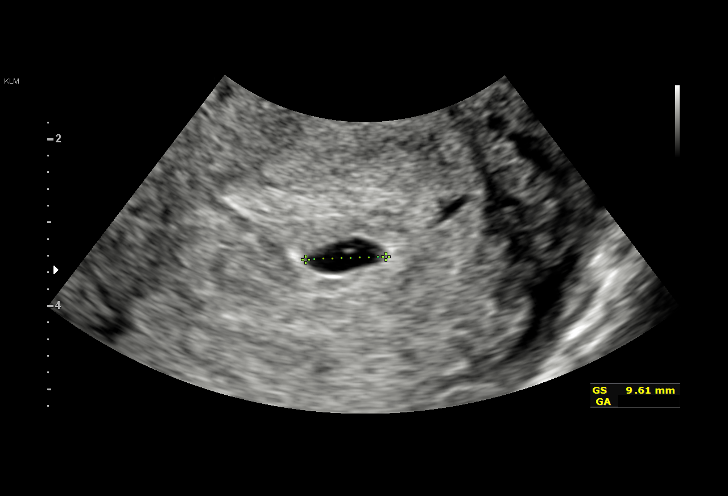
[im 26/34]
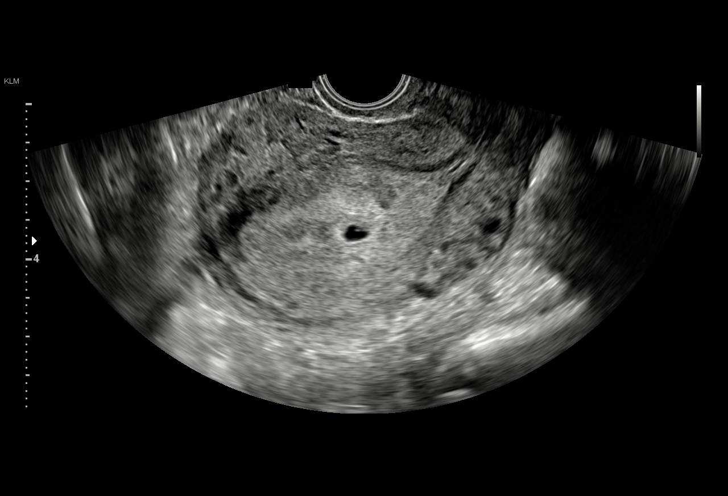
[im 29/34]
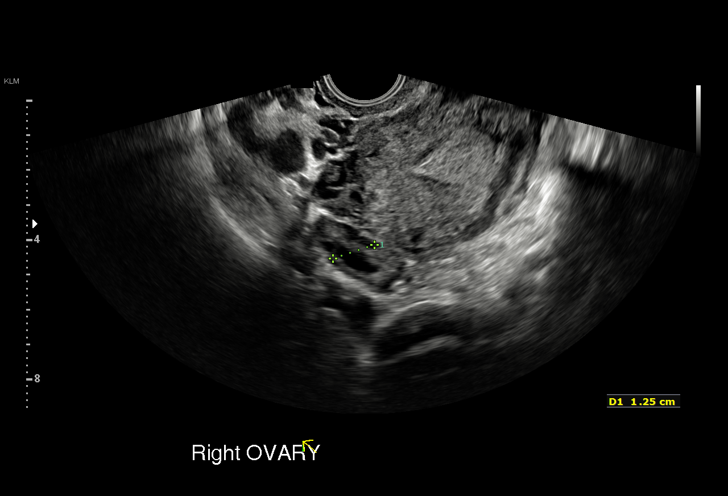
[im 31/34]
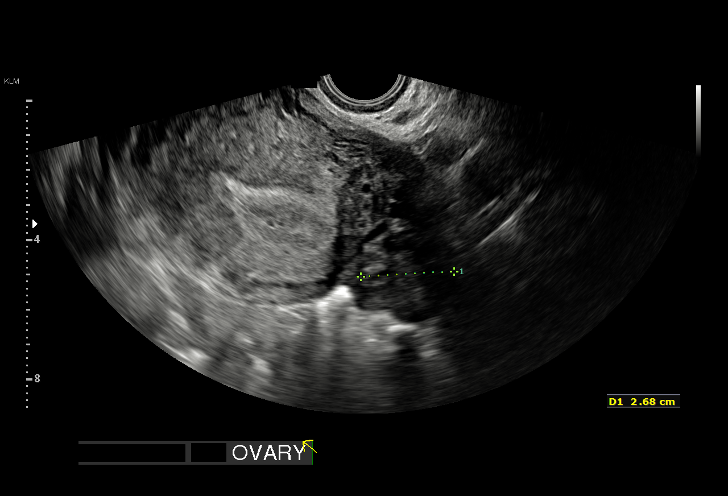
[im 34/34]
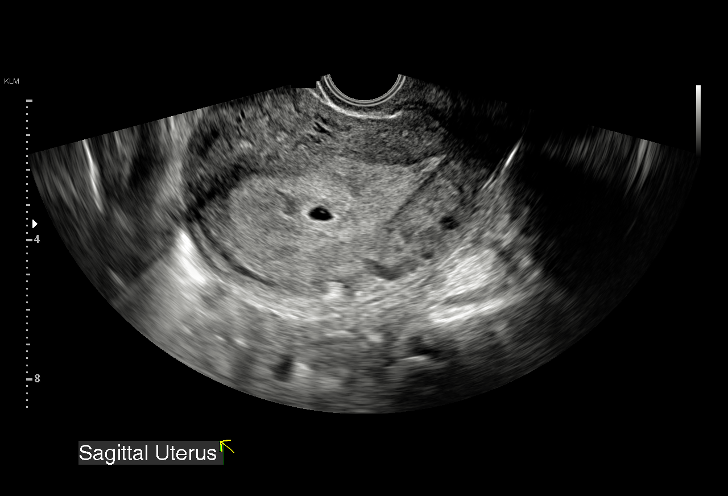

[15 of 28 positions shown; findings below may reference images not displayed]

FINDINGS: Intrauterine gestational sac: A single small gestational sac is
suspected in the lower uterus (image 2, image 15).

Yolk sac:  Visible (image 24).

Embryo:  Not visible

Cardiac Activity: Not applicable

MSD: 7.37 mm   5 w   3 d

Subchorionic hemorrhage: None visualized. Trace superimposed
endometrial fluid.

Maternal uterus/adnexae: Normal right ovary 1.3 x 3.5 x
centimeters. The left ovary also appears normal but is larger
measuring 2.7 x 3.2 x 3.5 centimeters. Probable corpus luteum on the
left. No pelvic free fluid.
IMPRESSION: Probable early intrauterine gestational sac with yolk sac, but no
fetal pole, or cardiac activity yet visualized.

Recommend follow-up quantitative B-HCG levels and follow-up US in 14
days to confirm and assess viability.

This recommendation follows SRU consensus guidelines: Diagnostic
Criteria for Nonviable Pregnancy Early in the First Trimester. N
Engl J Med 0507; [DATE].
